# Patient Record
Sex: Female | Born: 1980 | Hispanic: No | Marital: Married | Smoking: Never smoker
Health system: Southern US, Community
[De-identification: ages and names within clinical notes are randomized; demographics above are authoritative.]

## PROBLEM LIST (undated history)

## (undated) ENCOUNTER — Inpatient Hospital Stay (HOSPITAL_COMMUNITY): Payer: Self-pay

## (undated) DIAGNOSIS — E119 Type 2 diabetes mellitus without complications: Secondary | ICD-10-CM

## (undated) DIAGNOSIS — K429 Umbilical hernia without obstruction or gangrene: Secondary | ICD-10-CM

## (undated) DIAGNOSIS — J45909 Unspecified asthma, uncomplicated: Secondary | ICD-10-CM

## (undated) DIAGNOSIS — K851 Biliary acute pancreatitis without necrosis or infection: Secondary | ICD-10-CM

## (undated) DIAGNOSIS — N390 Urinary tract infection, site not specified: Secondary | ICD-10-CM

## (undated) DIAGNOSIS — D649 Anemia, unspecified: Secondary | ICD-10-CM

## (undated) DIAGNOSIS — O24419 Gestational diabetes mellitus in pregnancy, unspecified control: Secondary | ICD-10-CM

## (undated) HISTORY — DX: Anemia, unspecified: D64.9

## (undated) HISTORY — PX: NO PAST SURGERIES: SHX2092

## (undated) HISTORY — DX: Type 2 diabetes mellitus without complications: E11.9

---

## 2008-12-25 LAB — SICKLE CELL SCREEN: Sickle Cell Screen: NORMAL

## 2008-12-31 ENCOUNTER — Ambulatory Visit (HOSPITAL_COMMUNITY): Admission: RE | Admit: 2008-12-31 | Discharge: 2008-12-31 | Payer: Self-pay | Admitting: Obstetrics & Gynecology

## 2009-04-04 ENCOUNTER — Inpatient Hospital Stay (HOSPITAL_COMMUNITY): Admission: RE | Admit: 2009-04-04 | Discharge: 2009-04-07 | Payer: Self-pay | Admitting: Obstetrics and Gynecology

## 2009-04-04 ENCOUNTER — Ambulatory Visit: Payer: Self-pay | Admitting: Obstetrics & Gynecology

## 2009-04-06 ENCOUNTER — Encounter: Payer: Self-pay | Admitting: Obstetrics and Gynecology

## 2010-08-04 LAB — CBC
HCT: 36.4 % (ref 36.0–46.0)
Hemoglobin: 9.3 g/dL — ABNORMAL LOW (ref 12.0–15.0)
MCHC: 34.3 g/dL (ref 30.0–36.0)
MCV: 94 fL (ref 78.0–100.0)
Platelets: 151 10*3/uL (ref 150–400)
RDW: 15.1 % (ref 11.5–15.5)
RDW: 15.2 % (ref 11.5–15.5)
WBC: 6 10*3/uL (ref 4.0–10.5)

## 2010-08-04 LAB — RPR: RPR Ser Ql: NONREACTIVE

## 2012-02-29 ENCOUNTER — Other Ambulatory Visit (HOSPITAL_COMMUNITY): Payer: Self-pay | Admitting: Nurse Practitioner

## 2012-02-29 DIAGNOSIS — O3660X0 Maternal care for excessive fetal growth, unspecified trimester, not applicable or unspecified: Secondary | ICD-10-CM

## 2012-02-29 DIAGNOSIS — Z3689 Encounter for other specified antenatal screening: Secondary | ICD-10-CM

## 2012-02-29 LAB — OB RESULTS CONSOLE ABO/RH: RH Type: POSITIVE

## 2012-02-29 LAB — OB RESULTS CONSOLE GBS: GBS: POSITIVE

## 2012-02-29 LAB — OB RESULTS CONSOLE GC/CHLAMYDIA: Gonorrhea: NEGATIVE

## 2012-02-29 LAB — CYSTIC FIBROSIS DIAGNOSTIC STUDY: Interpretation-CFDNA:: NEGATIVE

## 2012-02-29 LAB — OB RESULTS CONSOLE ANTIBODY SCREEN: Antibody Screen: NEGATIVE

## 2012-03-02 ENCOUNTER — Ambulatory Visit (HOSPITAL_COMMUNITY)
Admission: RE | Admit: 2012-03-02 | Discharge: 2012-03-02 | Disposition: A | Discharge status: Home / Self Care | Payer: Medicaid Other | Source: Ambulatory Visit | Attending: Nurse Practitioner | Admitting: Nurse Practitioner

## 2012-03-02 DIAGNOSIS — Z3689 Encounter for other specified antenatal screening: Secondary | ICD-10-CM

## 2012-03-02 DIAGNOSIS — Z363 Encounter for antenatal screening for malformations: Secondary | ICD-10-CM | POA: Insufficient documentation

## 2012-03-02 DIAGNOSIS — O358XX0 Maternal care for other (suspected) fetal abnormality and damage, not applicable or unspecified: Secondary | ICD-10-CM | POA: Insufficient documentation

## 2012-03-02 DIAGNOSIS — Z1389 Encounter for screening for other disorder: Secondary | ICD-10-CM | POA: Insufficient documentation

## 2012-03-02 DIAGNOSIS — O3660X0 Maternal care for excessive fetal growth, unspecified trimester, not applicable or unspecified: Secondary | ICD-10-CM | POA: Insufficient documentation

## 2012-04-26 ENCOUNTER — Encounter (HOSPITAL_COMMUNITY): Payer: Self-pay | Admitting: *Deleted

## 2012-04-26 ENCOUNTER — Inpatient Hospital Stay (HOSPITAL_COMMUNITY)
Admission: AD | Admit: 2012-04-26 | Discharge: 2012-04-26 | Disposition: A | Discharge status: Home / Self Care | Payer: Self-pay | Source: Ambulatory Visit | Attending: Obstetrics and Gynecology | Admitting: Obstetrics and Gynecology

## 2012-04-26 DIAGNOSIS — R079 Chest pain, unspecified: Secondary | ICD-10-CM | POA: Insufficient documentation

## 2012-04-26 DIAGNOSIS — O368990 Maternal care for other specified fetal problems, unspecified trimester, not applicable or unspecified: Secondary | ICD-10-CM

## 2012-04-26 DIAGNOSIS — M549 Dorsalgia, unspecified: Secondary | ICD-10-CM | POA: Insufficient documentation

## 2012-04-26 DIAGNOSIS — O4700 False labor before 37 completed weeks of gestation, unspecified trimester: Secondary | ICD-10-CM | POA: Insufficient documentation

## 2012-04-26 HISTORY — DX: Urinary tract infection, site not specified: N39.0

## 2012-04-26 HISTORY — DX: Unspecified asthma, uncomplicated: J45.909

## 2012-04-26 LAB — URINALYSIS, ROUTINE W REFLEX MICROSCOPIC
Hgb urine dipstick: NEGATIVE
Leukocytes, UA: NEGATIVE
Nitrite: NEGATIVE
Protein, ur: NEGATIVE mg/dL
Specific Gravity, Urine: 1.02 (ref 1.005–1.030)
Urobilinogen, UA: 0.2 mg/dL (ref 0.0–1.0)

## 2012-04-26 MED ORDER — CYCLOBENZAPRINE HCL 10 MG PO TABS
10.0000 mg | ORAL_TABLET | Freq: Once | ORAL | Status: AC
  Administered 2012-04-26: 10 mg via ORAL
  Filled 2012-04-26: qty 1

## 2012-04-26 MED ORDER — CYCLOBENZAPRINE HCL 10 MG PO TABS: 10.0000 mg | ORAL_TABLET | Freq: Three times a day (TID) | ORAL | Status: DC | PRN

## 2012-04-26 MED ORDER — ACETAMINOPHEN-CODEINE #3 300-30 MG PO TABS
1.0000 | ORAL_TABLET | Freq: Once | ORAL | Status: AC
  Administered 2012-04-26: 1 via ORAL
  Filled 2012-04-26: qty 1

## 2012-04-26 MED ORDER — GI COCKTAIL ~~LOC~~
30.0000 mL | Freq: Once | ORAL | Status: AC
  Administered 2012-04-26: 30 mL via ORAL
  Filled 2012-04-26: qty 30

## 2012-04-26 NOTE — MAU Provider Note (Signed)
  History     CSN: [id number]  Arrival date and time: 04/26/12 1536   None     Chief Complaint  Patient presents with  . Chest Pain   HPI  Pt is a G2P1001 at 29.[redacted] wks gestation here with report of chest pain that radiates to the upper middle back.  Pain started at 1500 today, pain increased in  intensity with movement.  No pain with breathing.  No recent illness or report of vomiting or coughing.    Past Medical History  Diagnosis Date  . Asthma   . Urinary tract infection     GBS in urine current preg    Past Surgical History  Procedure Date  . No past surgeries     Family History  Problem Relation Age of Onset  . Other Neg Hx     History  Substance Use Topics  . Smoking status: Never Smoker   . Smokeless tobacco: Never Used  . Alcohol Use: No    Allergies: No Known Allergies  Prescriptions prior to admission  Medication Sig Dispense Refill  . Prenatal Vit-Fe Fumarate-FA (MULTIVITAMIN-PRENATAL) 27-0.8 MG TABS Take 1 tablet by mouth daily.        Review of Systems  Gastrointestinal: Positive for abdominal pain (upper abdominal with movement).  Musculoskeletal: Positive for back pain.  All other systems reviewed and are negative.   Physical Exam   Blood pressure 113/66, pulse 87, temperature 97.4 F (36.3 C), temperature source Oral, resp. rate 22, height 5' 3.5" (1.613 m), weight 77.202 kg (170 lb 3.2 oz), SpO2 100.00%.  Physical Exam  Constitutional: She is oriented to person, place, and time. She appears well-developed and well-nourished. No distress.  HENT:  Head: Normocephalic.  Neck: Normal range of motion. Neck supple.  Cardiovascular: Normal rate, regular rhythm and normal heart sounds.   Respiratory: Effort normal and breath sounds normal.  GI: Soft. There is no tenderness.  Genitourinary: No bleeding around the vagina. Vaginal discharge (mucusy) found.  Neurological: She is alert and oriented to person, place, and time.  Skin: Skin is warm  and dry.   Dilation: Closed Effacement (%): Thick Cervical Position: Posterior Exam by:: Roney Marion, CNM FHR 130's,+accels, reactive Toco - irritability  MAU Course  Procedures  Results for orders placed during the hospital encounter of 04/26/12 (from the past 24 hour(s))  URINALYSIS, ROUTINE W REFLEX MICROSCOPIC     Status: Abnormal   Collection Time   04/26/12  3:45 PM      Component Value Range   Color, Urine YELLOW  YELLOW   APPearance HAZY (*) CLEAR   Specific Gravity, Urine 1.020  1.005 - 1.030   pH 6.0  5.0 - 8.0   Glucose, UA NEGATIVE  NEGATIVE mg/dL   Hgb urine dipstick NEGATIVE  NEGATIVE   Bilirubin Urine NEGATIVE  NEGATIVE   Ketones, ur NEGATIVE  NEGATIVE mg/dL   Protein, ur NEGATIVE  NEGATIVE mg/dL   Urobilinogen, UA 0.2  0.0 - 1.0 mg/dL   Nitrite NEGATIVE  NEGATIVE   Leukocytes, UA NEGATIVE  NEGATIVE     Assessment and Plan  Back Pain in Pregnancy Braxton Hicks  Plan: DC to home RX Flexeril Preterm Labor Precautions Call for an appointment next week  [facility] 04/26/2012, 4:18 PM

## 2012-04-26 NOTE — MAU Note (Addendum)
Pt sitting up when entered rm.  Repositioned on rt side, hob lowered.

## 2012-04-26 NOTE — MAU Note (Signed)
3 pm, started having chest pain and tightness, pain in upper back.  Has hx of asthma- does NOT have an inhaler, called husband to come home and bring her in . (pt is able to breath with mouth closed- resp unlabored, though rapid denies cough)

## 2012-04-26 NOTE — MAU Provider Note (Signed)
Attestation of Attending Supervision of Advanced Practitioner: Evaluation and management procedures were performed by the PA/NP/CNM/OB Fellow under my supervision/collaboration. Chart reviewed and agree with management and plan.  Dimitria Ketchum V 04/26/2012 11:19 PM

## 2012-05-03 NOTE — L&D Delivery Note (Signed)
Delivery Note At 10:07 PM a viable female was delivered via Vaginal, Spontaneous Delivery (Presentation: ; Occiput Anterior).  APGAR: 8, 8; weight 8lbs 9 oz .   Placenta status: Intact, Spontaneous.  Cord: 3 vessels with the following complications: None.    Anesthesia: None  Episiotomy: None Lacerations: 2nd degree;Vaginal Suture Repair: 3.0 vicryl Est. Blood Loss (mL): 200  Mom to postpartum.  Baby to nursery-stable.  Tawnya Crook 07/10/2012, 10:34 PM

## 2012-07-05 ENCOUNTER — Telehealth (HOSPITAL_COMMUNITY): Payer: Self-pay | Admitting: *Deleted

## 2012-07-05 ENCOUNTER — Encounter (HOSPITAL_COMMUNITY): Payer: Self-pay | Admitting: *Deleted

## 2012-07-05 NOTE — Telephone Encounter (Signed)
Preadmission screen  

## 2012-07-05 NOTE — Telephone Encounter (Signed)
Preadmission screen Interpreter number [phone]

## 2012-07-10 ENCOUNTER — Encounter (HOSPITAL_COMMUNITY): Payer: Self-pay | Admitting: *Deleted

## 2012-07-10 ENCOUNTER — Inpatient Hospital Stay (HOSPITAL_COMMUNITY)
Admission: AD | Admit: 2012-07-10 | Discharge: 2012-07-12 | DRG: 775 | Disposition: A | Discharge status: Home / Self Care | Payer: Medicaid Other | Source: Ambulatory Visit | Attending: Obstetrics & Gynecology | Admitting: Obstetrics & Gynecology

## 2012-07-10 DIAGNOSIS — Z22330 Carrier of Group B streptococcus: Secondary | ICD-10-CM

## 2012-07-10 DIAGNOSIS — O99892 Other specified diseases and conditions complicating childbirth: Secondary | ICD-10-CM | POA: Diagnosis present

## 2012-07-10 LAB — CBC
Hemoglobin: 12.5 g/dL (ref 12.0–15.0)
MCV: 93.1 fL (ref 78.0–100.0)
Platelets: 164 10*3/uL (ref 150–400)
RBC: 3.9 MIL/uL (ref 3.87–5.11)
WBC: 8.3 10*3/uL (ref 4.0–10.5)

## 2012-07-10 MED ORDER — ONDANSETRON HCL 4 MG/2ML IJ SOLN: 4.0000 mg | INTRAMUSCULAR | Status: DC | PRN

## 2012-07-10 MED ORDER — BENZOCAINE-MENTHOL 20-0.5 % EX AERO
1.0000 "application " | INHALATION_SPRAY | CUTANEOUS | Status: DC | PRN
  Filled 2012-07-10 (×2): qty 56

## 2012-07-10 MED ORDER — DIBUCAINE 1 % RE OINT: 1.0000 "application " | TOPICAL_OINTMENT | RECTAL | Status: DC | PRN

## 2012-07-10 MED ORDER — WITCH HAZEL-GLYCERIN EX PADS: 1.0000 "application " | MEDICATED_PAD | CUTANEOUS | Status: DC | PRN

## 2012-07-10 MED ORDER — SODIUM CHLORIDE 0.9 % IV SOLN
2.0000 g | Freq: Once | INTRAVENOUS | Status: AC
  Administered 2012-07-10: 2 g via INTRAVENOUS
  Filled 2012-07-10: qty 2000

## 2012-07-10 MED ORDER — TERBUTALINE SULFATE 1 MG/ML IJ SOLN: 0.2500 mg | Freq: Once | INTRAMUSCULAR | Status: DC | PRN

## 2012-07-10 MED ORDER — OXYTOCIN 40 UNITS IN LACTATED RINGERS INFUSION - SIMPLE MED
62.5000 mL/h | INTRAVENOUS | Status: DC
  Filled 2012-07-10: qty 1000

## 2012-07-10 MED ORDER — ACETAMINOPHEN 325 MG PO TABS
650.0000 mg | ORAL_TABLET | ORAL | Status: DC | PRN
  Administered 2012-07-11: 650 mg via ORAL
  Filled 2012-07-10: qty 1

## 2012-07-10 MED ORDER — TETANUS-DIPHTH-ACELL PERTUSSIS 5-2.5-18.5 LF-MCG/0.5 IM SUSP
0.5000 mL | Freq: Once | INTRAMUSCULAR | Status: AC
  Administered 2012-07-11: 0.5 mL via INTRAMUSCULAR

## 2012-07-10 MED ORDER — SIMETHICONE 80 MG PO CHEW: 80.0000 mg | CHEWABLE_TABLET | ORAL | Status: DC | PRN

## 2012-07-10 MED ORDER — OXYTOCIN BOLUS FROM INFUSION
500.0000 mL | INTRAVENOUS | Status: DC
  Administered 2012-07-10: 500 mL via INTRAVENOUS

## 2012-07-10 MED ORDER — LACTATED RINGERS IV SOLN: 500.0000 mL | INTRAVENOUS | Status: DC | PRN

## 2012-07-10 MED ORDER — LANOLIN HYDROUS EX OINT: TOPICAL_OINTMENT | CUTANEOUS | Status: DC | PRN

## 2012-07-10 MED ORDER — SENNOSIDES-DOCUSATE SODIUM 8.6-50 MG PO TABS
2.0000 | ORAL_TABLET | Freq: Every day | ORAL | Status: DC
  Administered 2012-07-11: 2 via ORAL

## 2012-07-10 MED ORDER — DIPHENHYDRAMINE HCL 25 MG PO CAPS: 25.0000 mg | ORAL_CAPSULE | Freq: Four times a day (QID) | ORAL | Status: DC | PRN

## 2012-07-10 MED ORDER — LIDOCAINE HCL (PF) 1 % IJ SOLN
30.0000 mL | INTRAMUSCULAR | Status: DC | PRN
  Administered 2012-07-10: 30 mL via SUBCUTANEOUS
  Filled 2012-07-10 (×2): qty 30

## 2012-07-10 MED ORDER — CITRIC ACID-SODIUM CITRATE 334-500 MG/5ML PO SOLN: 30.0000 mL | ORAL | Status: DC | PRN

## 2012-07-10 MED ORDER — ONDANSETRON HCL 4 MG/2ML IJ SOLN: 4.0000 mg | Freq: Four times a day (QID) | INTRAMUSCULAR | Status: DC | PRN

## 2012-07-10 MED ORDER — OXYCODONE-ACETAMINOPHEN 5-325 MG PO TABS
1.0000 | ORAL_TABLET | ORAL | Status: DC | PRN
  Administered 2012-07-11 – 2012-07-12 (×5): 1 via ORAL
  Filled 2012-07-10 (×5): qty 1

## 2012-07-10 MED ORDER — LACTATED RINGERS IV SOLN
INTRAVENOUS | Status: DC
  Administered 2012-07-10: 16:00:00 via INTRAVENOUS

## 2012-07-10 MED ORDER — OXYTOCIN 40 UNITS IN LACTATED RINGERS INFUSION - SIMPLE MED
1.0000 m[IU]/min | INTRAVENOUS | Status: DC
  Administered 2012-07-10: 1 m[IU]/min via INTRAVENOUS

## 2012-07-10 MED ORDER — ZOLPIDEM TARTRATE 5 MG PO TABS: 5.0000 mg | ORAL_TABLET | Freq: Every evening | ORAL | Status: DC | PRN

## 2012-07-10 MED ORDER — ACETAMINOPHEN 325 MG PO TABS: 650.0000 mg | ORAL_TABLET | ORAL | Status: DC | PRN

## 2012-07-10 MED ORDER — PRENATAL MULTIVITAMIN CH
1.0000 | ORAL_TABLET | Freq: Every day | ORAL | Status: DC
  Administered 2012-07-12: 1 via ORAL
  Filled 2012-07-10: qty 1

## 2012-07-10 MED ORDER — ONDANSETRON HCL 4 MG PO TABS: 4.0000 mg | ORAL_TABLET | ORAL | Status: DC | PRN

## 2012-07-10 MED ORDER — FENTANYL CITRATE 0.05 MG/ML IJ SOLN
100.0000 ug | INTRAMUSCULAR | Status: DC | PRN
  Administered 2012-07-10 (×2): 100 ug via INTRAVENOUS
  Filled 2012-07-10 (×2): qty 2

## 2012-07-10 MED ORDER — OXYCODONE-ACETAMINOPHEN 5-325 MG PO TABS
1.0000 | ORAL_TABLET | ORAL | Status: DC | PRN
  Administered 2012-07-10: 1 via ORAL
  Filled 2012-07-10: qty 1

## 2012-07-10 NOTE — Progress Notes (Signed)
U/S at bedside by Dr. Marice Potter to determine presentation.  Cephalic presentation noted by MD

## 2012-07-10 NOTE — H&P (Signed)
Jill Ray is a 32 y.o. female presenting for active labor. Her contractions began last night and became stronger and regular this morning around 3am. She denies vaginal bleeding, discharge, LOF, and decreased fetal movement. She has a Hx of asthma but has not had to use any inhalers during her pregnancy. She does have occasional dyspnea. She has had a mild HA this morning. She denies vison changes, dysuria, and swelling. She denies any problems in her pregnancy and has seen the health department for her care.  History OB History   Grav Para Term Preterm Abortions TAB SAB Ect Mult Living   2 1 1  0 0 0 0 0 0 1     Past Medical History  Diagnosis Date  . Asthma   . Urinary tract infection     GBS in urine current preg  . Anemia    Past Surgical History  Procedure Laterality Date  . No past surgeries     Family History: family history includes Asthma in her maternal grandfather.  There is no history of Other. Social History:  reports that she has never smoked. She has never used smokeless tobacco. She reports that she does not drink alcohol or use illicit drugs.   Prenatal Transfer Tool  Maternal Diabetes: No Genetic Screening: Declined Maternal Ultrasounds/Referrals: Normal Fetal Ultrasounds or other Referrals:  None Maternal Substance Abuse:  No Significant Maternal Medications:  None Significant Maternal Lab Results:  Lab values include: Group B Strep positive Other Comments:  None  ROS Per HPI  Dilation: 8 Effacement (%): 100 Exam by:: Lynda Rainwater RN  Blood pressure 115/73, pulse 104, resp. rate 18. Exam Physical Exam  Gen: NAD, alert, cooperative with exam HEENT: NCAT, moist mucosal membranes CV: RRR, good S1/S2, no murmur Resp: CTABL, no wheezes, non-labored Abd: Soft, pregnant abdomen, no tenderness to palpation Ext: No edema Neuro: Alert and oriented, No gross deficits  FHT: Baseline 145, moderate variability, no accels, no decels Toco: irreg q3-5  min  Prenatal labs: ABO, Rh: O/Positive/-- (10/29 0000) Antibody: Negative (10/29 0000) Rubella: Immune (10/29 0000) RPR: Nonreactive (10/29 0000)  HBsAg: Negative (10/29 0000)  HIV: Non-reactive (10/29 0000)  GBS: Positive (10/29 0000)   Assessment/Plan: 32 y/o G2P1001 here in active labor - Active labor, manage expectantly has changed from 6 to 8 cm dilated since being sent from clinic, membranes intact - Category 1 fetal tracing - GBS positive- ampicillin - Anticipate SVD  Kevin Fenton 07/10/2012, 4:02 PM  I saw and examined patient along with student and agree with above note.   Sadiel Mota 07/13/2012 3:57 PM

## 2012-07-11 DIAGNOSIS — O9989 Other specified diseases and conditions complicating pregnancy, childbirth and the puerperium: Secondary | ICD-10-CM

## 2012-07-11 DIAGNOSIS — Z22330 Carrier of Group B streptococcus: Secondary | ICD-10-CM

## 2012-07-11 LAB — ABO/RH: ABO/RH(D): O POS

## 2012-07-11 LAB — SYPHILIS: RPR W/REFLEX TO RPR TITER AND TREPONEMAL ANTIBODIES, TRADITIONAL SCREENING AND DIAGNOSIS ALGORITHM: RPR Ser Ql: NONREACTIVE

## 2012-07-12 MED ORDER — ACETAMINOPHEN-CODEINE 300-30 MG PO TABS: 1.0000 | ORAL_TABLET | ORAL | Status: DC | PRN

## 2012-07-12 NOTE — Discharge Summary (Signed)
Obstetric Discharge Summary Reason for Admission: onset of labor Prenatal Procedures: NST and ultrasound Intrapartum Procedures: spontaneous vaginal delivery and GBS prophylaxis Postpartum Procedures: none Complications-Operative and Postpartum: 2nd degree perineal laceration Hemoglobin  Date Value Range Status  07/10/2012 12.5  12.0 - 15.0 g/dL Final     HCT  Date Value Range Status  07/10/2012 36.3  36.0 - 46.0 % Final    Physical Exam:  Filed Vitals:   07/12/12 0600  BP: 95/64  Pulse: 99  Temp: 97.9 F (36.6 C)  Resp: 20    General: alert, cooperative and no distress Lochia: appropriate Uterine Fundus: firm Incision: None DVT Evaluation: No evidence of DVT seen on physical exam. Negative Homan's sign. No cords or calf tenderness.  Discharge Diagnoses: Term Pregnancy-delivered  Discharge Information: Date: 07/12/2012 Activity: pelvic rest Diet: routine Medications: Ibuprofen Condition: stable Instructions: refer to practice specific booklet Discharge to: home   Newborn Data: Live born female  Birth Weight: 8 lb 9.9 oz (3910 g) APGAR: 8, 8  Home with mother.  [facility] 07/12/2012, 9:15 AM

## 2012-07-12 NOTE — Discharge Summary (Signed)
Attestation of Attending Supervision of Advanced Practitioner (CNM/NP): Evaluation and management procedures were performed by the Advanced Practitioner under my supervision and collaboration.  I have reviewed the Advanced Practitioner's note and chart, and I agree with the management and plan.  CONSTANT,PEGGY 07/12/2012 11:07 AM   

## 2012-07-12 NOTE — Progress Notes (Signed)
UR chart review completed.  

## 2012-07-17 ENCOUNTER — Encounter: Payer: Self-pay | Admitting: Obstetrics and Gynecology

## 2012-08-08 ENCOUNTER — Encounter (HOSPITAL_COMMUNITY): Payer: Self-pay | Admitting: Physical Medicine and Rehabilitation

## 2012-08-08 ENCOUNTER — Inpatient Hospital Stay (HOSPITAL_COMMUNITY)
Admission: EM | Admit: 2012-08-08 | Discharge: 2012-08-14 | DRG: 769 | Disposition: A | Discharge status: Home / Self Care | Payer: Medicaid Other | Attending: General Surgery | Admitting: General Surgery

## 2012-08-08 ENCOUNTER — Emergency Department (HOSPITAL_COMMUNITY): Payer: Medicaid Other

## 2012-08-08 DIAGNOSIS — O99893 Other specified diseases and conditions complicating puerperium: Secondary | ICD-10-CM | POA: Diagnosis present

## 2012-08-08 DIAGNOSIS — R112 Nausea with vomiting, unspecified: Secondary | ICD-10-CM | POA: Diagnosis present

## 2012-08-08 DIAGNOSIS — K8020 Calculus of gallbladder without cholecystitis without obstruction: Secondary | ICD-10-CM

## 2012-08-08 DIAGNOSIS — K828 Other specified diseases of gallbladder: Secondary | ICD-10-CM | POA: Diagnosis present

## 2012-08-08 DIAGNOSIS — K859 Acute pancreatitis without necrosis or infection, unspecified: Secondary | ICD-10-CM | POA: Diagnosis present

## 2012-08-08 DIAGNOSIS — O9081 Anemia of the puerperium: Secondary | ICD-10-CM | POA: Diagnosis present

## 2012-08-08 DIAGNOSIS — K851 Biliary acute pancreatitis without necrosis or infection: Secondary | ICD-10-CM | POA: Diagnosis present

## 2012-08-08 DIAGNOSIS — O9089 Other complications of the puerperium, not elsewhere classified: Principal | ICD-10-CM | POA: Diagnosis present

## 2012-08-08 DIAGNOSIS — K8000 Calculus of gallbladder with acute cholecystitis without obstruction: Secondary | ICD-10-CM | POA: Diagnosis present

## 2012-08-08 DIAGNOSIS — D649 Anemia, unspecified: Secondary | ICD-10-CM | POA: Diagnosis present

## 2012-08-08 DIAGNOSIS — K429 Umbilical hernia without obstruction or gangrene: Secondary | ICD-10-CM | POA: Diagnosis present

## 2012-08-08 HISTORY — DX: Umbilical hernia without obstruction or gangrene: K42.9

## 2012-08-08 HISTORY — DX: Biliary acute pancreatitis without necrosis or infection: K85.10

## 2012-08-08 LAB — CBC WITH DIFFERENTIAL/PLATELET
Basophils Absolute: 0 10*3/uL (ref 0.0–0.1)
HCT: 40.4 % (ref 36.0–46.0)
Lymphocytes Relative: 13 % (ref 12–46)
Lymphs Abs: 1.2 10*3/uL (ref 0.7–4.0)
MCV: 87.6 fL (ref 78.0–100.0)
Monocytes Absolute: 0.7 10*3/uL (ref 0.1–1.0)
Neutro Abs: 7.4 10*3/uL (ref 1.7–7.7)
Platelets: 186 10*3/uL (ref 150–400)
RBC: 4.61 MIL/uL (ref 3.87–5.11)
RDW: 12.6 % (ref 11.5–15.5)
WBC: 9.4 10*3/uL (ref 4.0–10.5)

## 2012-08-08 LAB — CBC
MCH: 31.2 pg (ref 26.0–34.0)
Platelets: 205 10*3/uL (ref 150–400)
RBC: 4.46 MIL/uL (ref 3.87–5.11)

## 2012-08-08 LAB — URINALYSIS, ROUTINE W REFLEX MICROSCOPIC
Bilirubin Urine: NEGATIVE
Glucose, UA: NEGATIVE mg/dL
Ketones, ur: NEGATIVE mg/dL
Protein, ur: NEGATIVE mg/dL

## 2012-08-08 LAB — CREATININE, SERUM: Creatinine, Ser: 0.47 mg/dL — ABNORMAL LOW (ref 0.50–1.10)

## 2012-08-08 LAB — COMPREHENSIVE METABOLIC PANEL
ALT: 328 U/L — ABNORMAL HIGH (ref 0–35)
AST: 419 U/L — ABNORMAL HIGH (ref 0–37)
CO2: 25 mEq/L (ref 19–32)
Chloride: 106 mEq/L (ref 96–112)
GFR calc Af Amer: >90 mL/min (ref >90)
GFR calc non Af Amer: >90 mL/min (ref >90)
Glucose, Bld: 119 mg/dL — ABNORMAL HIGH (ref 70–99)
Sodium: 142 mEq/L (ref 135–145)
Total Bilirubin: 0.4 mg/dL (ref 0.3–1.2)

## 2012-08-08 LAB — URINE MICROSCOPIC-ADD ON

## 2012-08-08 MED ORDER — PANTOPRAZOLE SODIUM 40 MG IV SOLR
40.0000 mg | Freq: Every day | INTRAVENOUS | Status: DC
  Administered 2012-08-08 – 2012-08-13 (×6): 40 mg via INTRAVENOUS
  Filled 2012-08-08 (×9): qty 40

## 2012-08-08 MED ORDER — HEPARIN SODIUM (PORCINE) 5000 UNIT/ML IJ SOLN
5000.0000 [IU] | Freq: Three times a day (TID) | INTRAMUSCULAR | Status: DC
  Administered 2012-08-08 – 2012-08-14 (×14): 5000 [IU] via SUBCUTANEOUS
  Filled 2012-08-08 (×22): qty 1

## 2012-08-08 MED ORDER — ONDANSETRON HCL 4 MG/2ML IJ SOLN
4.0000 mg | Freq: Once | INTRAMUSCULAR | Status: AC
  Administered 2012-08-08: 4 mg via INTRAVENOUS
  Filled 2012-08-08: qty 2

## 2012-08-08 MED ORDER — HYDROMORPHONE HCL PF 1 MG/ML IJ SOLN
0.5000 mg | Freq: Once | INTRAMUSCULAR | Status: AC
  Administered 2012-08-08: 0.5 mg via INTRAVENOUS
  Filled 2012-08-08: qty 1

## 2012-08-08 MED ORDER — SODIUM CHLORIDE 0.9 % IV SOLN
INTRAVENOUS | Status: DC
  Administered 2012-08-08 – 2012-08-09 (×2): via INTRAVENOUS

## 2012-08-08 MED ORDER — GI COCKTAIL ~~LOC~~
30.0000 mL | Freq: Once | ORAL | Status: AC
Start: 2012-08-08 — End: 2012-08-08
  Administered 2012-08-08: 30 mL via ORAL
  Filled 2012-08-08: qty 30

## 2012-08-08 MED ORDER — ONDANSETRON HCL 4 MG/2ML IJ SOLN
4.0000 mg | Freq: Four times a day (QID) | INTRAMUSCULAR | Status: DC | PRN
  Administered 2012-08-11: 4 mg via INTRAVENOUS
  Filled 2012-08-08: qty 2

## 2012-08-08 MED ORDER — ACETAMINOPHEN 325 MG PO TABS
650.0000 mg | ORAL_TABLET | Freq: Four times a day (QID) | ORAL | Status: DC | PRN
  Administered 2012-08-13 – 2012-08-14 (×3): 650 mg via ORAL
  Filled 2012-08-08: qty 2
  Filled 2012-08-08: qty 1
  Filled 2012-08-08 (×5): qty 2

## 2012-08-08 MED ORDER — SODIUM CHLORIDE 0.9 % IV SOLN
1000.0000 mL | Freq: Once | INTRAVENOUS | Status: AC
  Administered 2012-08-08: 1000 mL via INTRAVENOUS

## 2012-08-08 MED ORDER — ONDANSETRON HCL 4 MG/2ML IJ SOLN
4.0000 mg | Freq: Once | INTRAMUSCULAR | Status: AC
  Administered 2012-08-08: 4 mg via INTRAVENOUS

## 2012-08-08 MED ORDER — HYDROMORPHONE HCL PF 1 MG/ML IJ SOLN
0.5000 mg | INTRAMUSCULAR | Status: DC | PRN
  Administered 2012-08-08 (×4): 0.5 mg via INTRAVENOUS
  Filled 2012-08-08 (×4): qty 1

## 2012-08-08 MED ORDER — ONDANSETRON HCL 4 MG/2ML IJ SOLN
INTRAMUSCULAR | Status: AC
  Filled 2012-08-08: qty 2

## 2012-08-08 MED ORDER — MORPHINE SULFATE 2 MG/ML IJ SOLN
2.0000 mg | INTRAMUSCULAR | Status: DC | PRN
  Administered 2012-08-08 – 2012-08-11 (×14): 2 mg via INTRAVENOUS
  Filled 2012-08-08 (×13): qty 1

## 2012-08-08 MED ORDER — ACETAMINOPHEN 650 MG RE SUPP
650.0000 mg | Freq: Four times a day (QID) | RECTAL | Status: DC | PRN
  Administered 2012-08-09 – 2012-08-10 (×3): 650 mg via RECTAL
  Filled 2012-08-08 (×3): qty 1

## 2012-08-08 MED ORDER — MORPHINE SULFATE 2 MG/ML IJ SOLN
INTRAMUSCULAR | Status: AC
  Filled 2012-08-08: qty 1

## 2012-08-08 MED ORDER — SODIUM CHLORIDE 0.9 % IV SOLN
1000.0000 mL | Freq: Once | INTRAVENOUS | Status: AC
Start: 2012-08-08 — End: 2012-08-08
  Administered 2012-08-08: 1000 mL via INTRAVENOUS

## 2012-08-08 NOTE — ED Notes (Addendum)
Pt presents to department for evaluation of epigastric pain radiating under R breast area to back. Onset this morning @ 05:00. Pt states she has same symptoms when she was pregnant, had new baby 22 days ago. Also states SOB and generalized fatigue. Pt is conscious alert and oriented x4. 8/10 pain at the time.

## 2012-08-08 NOTE — H&P (Signed)
Jill Ray is an 32 y.o. female.   Chief Complaint: referred by Dr. Jeraldine Loots HPI: 66 yof who delivered her son 22 days ago.  She has now three episodes of ruq/epigastric pain radiating to the back.  This is associated with n/v.  This episode began this am and is the worst.  She denies fevers.  Nothing was relieving this at home. Unable to eat.  She presented to er.  Past Medical History  Diagnosis Date  . Asthma   . Urinary tract infection     GBS in urine current preg  . Anemia     Past Surgical History  Procedure Laterality Date  . No past surgeries      Family History  Problem Relation Age of Onset  . Other Neg Hx   . Asthma Maternal Grandfather    Social History:  reports that she has never smoked. She has never used smokeless tobacco. She reports that she does not drink alcohol or use illicit drugs.  Allergies:  Allergies  Allergen Reactions  . Levaquin (Levofloxacin In D5w) Itching   meds non  Results for orders placed during the hospital encounter of 08/08/12 (from the past 48 hour(s))  CBC WITH DIFFERENTIAL     Status: Abnormal   Collection Time    08/08/12  7:39 AM      Result Value Range   WBC 9.4  4.0 - 10.5 K/uL   RBC 4.61  3.87 - 5.11 MIL/uL   Hemoglobin 14.4  12.0 - 15.0 g/dL   HCT 16.1  09.6 - 04.5 %   MCV 87.6  78.0 - 100.0 fL   MCH 31.2  26.0 - 34.0 pg   MCHC 35.6  30.0 - 36.0 g/dL   RDW 40.9  81.1 - 91.4 %   Platelets 186  150 - 400 K/uL   Neutrophils Relative 78 (*) 43 - 77 %   Neutro Abs 7.4  1.7 - 7.7 K/uL   Lymphocytes Relative 13  12 - 46 %   Lymphs Abs 1.2  0.7 - 4.0 K/uL   Monocytes Relative 7  3 - 12 %   Monocytes Absolute 0.7  0.1 - 1.0 K/uL   Eosinophils Relative 1  0 - 5 %   Eosinophils Absolute 0.1  0.0 - 0.7 K/uL   Basophils Relative 0  0 - 1 %   Basophils Absolute 0.0  0.0 - 0.1 K/uL  COMPREHENSIVE METABOLIC PANEL     Status: Abnormal   Collection Time    08/08/12  7:39 AM      Result Value Range   Sodium 142  135 - 145  mEq/L   Potassium 4.0  3.5 - 5.1 mEq/L   Chloride 106  96 - 112 mEq/L   CO2 25  19 - 32 mEq/L   Glucose, Bld 119 (*) 70 - 99 mg/dL   BUN 13  6 - 23 mg/dL   Creatinine, Ser 7.82  0.50 - 1.10 mg/dL   Calcium 9.9  8.4 - 95.6 mg/dL   Total Protein 7.6  6.0 - 8.3 g/dL   Albumin 3.7  3.5 - 5.2 g/dL   AST 213 (*) 0 - 37 U/L   ALT 328 (*) 0 - 35 U/L   Alkaline Phosphatase 227 (*) 39 - 117 U/L   Total Bilirubin 0.4  0.3 - 1.2 mg/dL   GFR calc non Af Amer >90  >90 mL/min   GFR calc Af Amer >90  >90 mL/min   Comment:  The eGFR has been calculated     using the CKD EPI equation.     This calculation has not been     validated in all clinical     situations.     eGFR's persistently     <90 mL/min signify     possible Chronic Kidney Disease.  LIPASE, BLOOD     Status: Abnormal   Collection Time    08/08/12  7:39 AM      Result Value Range   Lipase >3000 (*) 11 - 59 U/L   Comment: RESULT CONFIRMED BY AUTOMATED DILUTION   US Abdomen Complete  08/08/2012  *RADIOLOGY REPORT*  Clinical Data:  Abdominal pain and chest pain.  COMPLETE ABDOMINAL ULTRASOUND  Comparison:  None.  Findings:  Gallbladder:  Numerous small shadowing gallstones and moderate echogenic sludge within the gallbladder.  Circumferential gallbladder wall thickening up to approximately 6 mm.  Largest gallstone is on the order of 1.1 cm.  No pericholecystic fluid. Negative sonographic Murphy's sign according to the ultrasound technologist.  Common bile duct:  Dilated up to approximately 11 mm.  Sludge and small stones suspected within the common bile duct.  Liver:  Normal size and echotexture without focal parenchymal abnormality.  Patent portal vein with hepatopetal flow.  IVC:  Patent.  Pancreas:  Although the pancreas is difficult to visualize in its entirety, no focal pancreatic abnormality is identified.  Spleen:  Normal size and echotexture without focal parenchymal abnormality.  Right Kidney:  No hydronephrosis.   Well-preserved cortex.  No shadowing calculi.  Normal size and parenchymal echotexture without focal abnormalities.  Approximately 10.4 cm in length.  Left Kidney:  No hydronephrosis.  Well-preserved cortex.  No shadowing calculi.  Normal size and parenchymal echotexture without focal abnormalities.  Approximately 9.8 cm in length.  Abdominal aorta:  Normal in caliber throughout its visualized course in the abdomen.  Obscured distally by overlying bowel gas.  IMPRESSION:  1.  Cholelithiasis, gallbladder sludge, and gallbladder wall thickening consistent with cholecystitis. 2.  Mild extrahepatic biliary ductal dilation with evidence of sludge and small stones in the common duct. 3.  Otherwise normal examination with a caveat that the distal abdominal aorta was obscured by overlying bowel gas and was therefore not evaluated.   Original Report Authenticated By: Hulan Saas, M.D.     Review of Systems  Gastrointestinal: Positive for nausea, vomiting and abdominal pain.    Blood pressure 113/76, pulse 87, temperature 98.5 F (36.9 C), temperature source Oral, resp. rate 18, SpO2 95.00%, unknown if currently breastfeeding. Physical Exam  Vitals reviewed. Constitutional: She appears well-developed and well-nourished.  Eyes: No scleral icterus.  Cardiovascular: Normal rate, regular rhythm and normal heart sounds.   Respiratory: Effort normal and breath sounds normal. She has no wheezes. She has no rales.  GI: Normal appearance and bowel sounds are normal. She exhibits no distension. There is tenderness in the right upper quadrant and epigastric area. A hernia (reducible umbilical hernia) is present.  Lymphadenopathy:    She has no cervical adenopathy.     Assessment/Plan GSP  Discussed stones as source of pancreatitis.  Will plan on conservative therapy for pancreatitis and allow this to resolve.  Once this resolves will plan lap chole prior to discharge.  Deavion Dobbs 08/08/2012, 11:54  AM

## 2012-08-08 NOTE — ED Notes (Signed)
Pt actively vomiting. Medicated, will continue to monitor. Vital signs stable. 8/10 abdominal pain at the time. She remains alert and oriented x4.

## 2012-08-08 NOTE — ED Notes (Signed)
Pt transported to ultrasound.

## 2012-08-08 NOTE — ED Provider Notes (Signed)
History     CSN: [id number]  Arrival date & time 08/08/12  0705   First MD Initiated Contact with Patient 08/08/12 0715      Chief Complaint  Patient presents with  . Abdominal Pain  . Chest Pain    HPI  Patient presents with epigastric pain, nausea, vomiting. Symptoms began approximately 3 hours prior to ED arrival.  Onset was sudden.  Since onset the pain has been severe, nonradiating, in the epigastrium.  There is associated nausea with several episodes of emesis. No relief with anything. No concurrent fever, chest pain, significant dyspnea, diarrhea, incontinence or other complaints. She states that she has similar prior episodes, including one that required ER evaluation. She denies a known history of gallbladder or stomach pathology. She recently delivered a healthy young female full-term with no palpitations beyond the GBS. No history of eclampsia, nor preeclampsia during pregnancy.  Past Medical History  Diagnosis Date  . Asthma   . Urinary tract infection     GBS in urine current preg  . Anemia     Past Surgical History  Procedure Laterality Date  . No past surgeries      Family History  Problem Relation Age of Onset  . Other Neg Hx   . Asthma Maternal Grandfather     History  Substance Use Topics  . Smoking status: Never Smoker   . Smokeless tobacco: Never Used  . Alcohol Use: No    OB History   Grav Para Term Preterm Abortions TAB SAB Ect Mult Living   2 2 2  0 0 0 0 0 0 2      Review of Systems  Constitutional:       Per HPI, otherwise negative  HENT:       Per HPI, otherwise negative  Respiratory:       Per HPI, otherwise negative  Cardiovascular:       Per HPI, otherwise negative  Gastrointestinal: Positive for nausea, vomiting and abdominal pain. Negative for diarrhea, blood in stool and anal bleeding.  Endocrine:       Negative aside from HPI  Genitourinary: Negative for vaginal bleeding and vaginal discharge.  Musculoskeletal:        Per HPI, otherwise negative  Skin: Negative.   Neurological: Negative for syncope.    Allergies  Levaquin  Home Medications   Current Outpatient Rx  Name  Route  Sig  Dispense  Refill  . calcium carbonate (OS-CAL) 1250 MG chewable tablet   Oral   Chew 1 tablet by mouth once.         Marland Kitchen omeprazole (PRILOSEC) 20 MG capsule   Oral   Take 40 mg by mouth daily.         . Prenatal Vit-Fe Fumarate-FA (MULTIVITAMIN-PRENATAL) 27-0.8 MG TABS   Oral   Take 1 tablet by mouth daily.           BP 124/80  Pulse 78  Temp(Src) 98.5 F (36.9 C) (Oral)  Resp 18  SpO2 100%  Physical Exam  Nursing note and vitals reviewed. Constitutional: She is oriented to person, place, and time. She appears well-developed and well-nourished. No distress.  HENT:  Head: Normocephalic and atraumatic.  Eyes: Conjunctivae and EOM are normal.  Cardiovascular: Normal rate and regular rhythm.   Pulmonary/Chest: Effort normal and breath sounds normal. No stridor. No respiratory distress.  Abdominal: She exhibits no distension. There is no hepatosplenomegaly. There is tenderness in the epigastric area. There is no rigidity, no  rebound, no guarding, no CVA tenderness, no tenderness at McBurney's point and negative Murphy's sign.  Musculoskeletal: She exhibits no edema.  Neurological: She is alert and oriented to person, place, and time. No cranial nerve deficit.  Skin: Skin is warm and dry.  Psychiatric: She has a normal mood and affect.   Cardiac: 80sr, normal  O2- 99%ra, normal   ED Course  Procedures (including critical care time)  Labs Reviewed  CBC WITH DIFFERENTIAL  COMPREHENSIVE METABOLIC PANEL  LIPASE, BLOOD  URINALYSIS, ROUTINE W REFLEX MICROSCOPIC   No results found.   No diagnosis found.   I reviewed the patient's chart, including details of her recent delivery.  9:20 AM Initial labs demonstrate elevated lipase, hepatic enzymes. On re-check the patient requests  analgesics.   Initial labs are notable for a lipase of greater than 3000.  Ultrasound pending. MDM  The patient presents with epigastric pain, nausea, vomiting.  On exam the patient is uncomfortable appearing.  Pain is relieved with intermittent Dilaudid, Zofran.  The patient's evaluation is notable for cholelithiasis, cholecystitis, likely pancreatic irritation secondary to biliary dysfunction.  The patient was admitted to the surgical team for further evaluation and management.      Gerhard Munch, MD 08/08/12 [phone]

## 2012-08-09 LAB — COMPREHENSIVE METABOLIC PANEL
ALT: 176 U/L — ABNORMAL HIGH (ref 0–35)
BUN: 8 mg/dL (ref 6–23)
Calcium: 8.3 mg/dL — ABNORMAL LOW (ref 8.4–10.5)
Creatinine, Ser: 0.41 mg/dL — ABNORMAL LOW (ref 0.50–1.10)
GFR calc Af Amer: >90 mL/min (ref >90)
Glucose, Bld: 117 mg/dL — ABNORMAL HIGH (ref 70–99)
Sodium: 139 mEq/L (ref 135–145)
Total Protein: 6.4 g/dL (ref 6.0–8.3)

## 2012-08-09 LAB — CBC
HCT: 37.9 % (ref 36.0–46.0)
Hemoglobin: 13.6 g/dL (ref 12.0–15.0)
RBC: 4.29 MIL/uL (ref 3.87–5.11)
WBC: 7.5 10*3/uL (ref 4.0–10.5)

## 2012-08-09 MED ORDER — CHLORHEXIDINE GLUCONATE 0.12 % MT SOLN
15.0000 mL | Freq: Two times a day (BID) | OROMUCOSAL | Status: DC
  Administered 2012-08-09 – 2012-08-14 (×8): 15 mL via OROMUCOSAL
  Filled 2012-08-09 (×8): qty 15

## 2012-08-09 MED ORDER — KCL IN DEXTROSE-NACL 20-5-0.45 MEQ/L-%-% IV SOLN
INTRAVENOUS | Status: DC
  Administered 2012-08-09: 100 mL/h via INTRAVENOUS
  Administered 2012-08-09 – 2012-08-14 (×6): via INTRAVENOUS
  Filled 2012-08-09 (×12): qty 1000

## 2012-08-09 MED ORDER — BIOTENE DRY MOUTH MT LIQD
15.0000 mL | Freq: Two times a day (BID) | OROMUCOSAL | Status: DC
  Administered 2012-08-09 – 2012-08-12 (×5): 15 mL via OROMUCOSAL

## 2012-08-09 NOTE — Progress Notes (Signed)
Agree with above, await pancreatitis to resolve, she is better clinically since yesterday

## 2012-08-09 NOTE — Progress Notes (Signed)
Nutrition Brief Note  Patient identified on the Malnutrition Screening Tool (MST) Report  Current diet order is NPO. Labs and medications reviewed.   Pt admitted with acute pancreatitis.  Very sleepy at time of visit, however declines nutrition-related needs at this time.  Per chart review, no evidence of recent wt loss. No nutrition interventions warranted at this time. If nutrition issues arise, please consult RD.   Loyce Dys, MS RD LDN Clinical Inpatient Dietitian Pager: [phone] Weekend/After hours pager: [phone]

## 2012-08-09 NOTE — Progress Notes (Signed)
Subjective: Pt still in epigastric and RUQ abdominal pain and c/o of nausea, but no vomiting.  NPO.  Ambulating some.  +flatus, no BM recently.    Objective: Vital signs in last 24 hours: Temp:  [97.8 F (36.6 C)-98.7 F (37.1 C)] 98.7 F (37.1 C) (04/09 0500) Pulse Rate:  [79-88] 88 (04/09 0500) Resp:  [16-20] 16 (04/09 0500) BP: (108-124)/(56-79) 117/75 mmHg (04/09 0500) SpO2:  [95 %-100 %] 99 % (04/09 0500)    Intake/Output from previous day: 04/08 0701 - 04/09 0700 In: 2000 [I.V.:2000] Out: 950 [Urine:800] Intake/Output this shift:    PE: Gen:  Alert, NAD, pleasant Abd: Soft, moderate tenderness in upper abdomen, ND, +BS, no HSM   Lab Results:   Recent Labs  08/08/12 1515 08/09/12 0510  WBC 10.6* 7.5  HGB 13.9 13.6  HCT 39.6 37.9  PLT 205 198   BMET  Recent Labs  08/08/12 0739 08/08/12 1515 08/09/12 0510  NA 142  --  139  K 4.0  --  3.4*  CL 106  --  105  CO2 25  --  26  GLUCOSE 119*  --  117*  BUN 13  --  8  CREATININE 0.59 0.47* 0.41*  CALCIUM 9.9  --  8.3*   PT/INR No results found for this basename: LABPROT, INR,  in the last 72 hours CMP     Component Value Date/Time   NA 139 08/09/2012 0510   K 3.4* 08/09/2012 0510   CL 105 08/09/2012 0510   CO2 26 08/09/2012 0510   GLUCOSE 117* 08/09/2012 0510   BUN 8 08/09/2012 0510   CREATININE 0.41* 08/09/2012 0510   CALCIUM 8.3* 08/09/2012 0510   PROT 6.4 08/09/2012 0510   ALBUMIN 3.1* 08/09/2012 0510   AST 70* 08/09/2012 0510   ALT 176* 08/09/2012 0510   ALKPHOS 169* 08/09/2012 0510   BILITOT 0.4 08/09/2012 0510   GFRNONAA >90 08/09/2012 0510   GFRAA >90 08/09/2012 0510   Lipase     Component Value Date/Time   LIPASE 2791* 08/09/2012 0510       Studies/Results: US Abdomen Complete  08/08/2012  *RADIOLOGY REPORT*  Clinical Data:  Abdominal pain and chest pain.  COMPLETE ABDOMINAL ULTRASOUND  Comparison:  None.  Findings:  Gallbladder:  Numerous small shadowing gallstones and moderate echogenic sludge within the  gallbladder.  Circumferential gallbladder wall thickening up to approximately 6 mm.  Largest gallstone is on the order of 1.1 cm.  No pericholecystic fluid. Negative sonographic Murphy's sign according to the ultrasound technologist.  Common bile duct:  Dilated up to approximately 11 mm.  Sludge and small stones suspected within the common bile duct.  Liver:  Normal size and echotexture without focal parenchymal abnormality.  Patent portal vein with hepatopetal flow.  IVC:  Patent.  Pancreas:  Although the pancreas is difficult to visualize in its entirety, no focal pancreatic abnormality is identified.  Spleen:  Normal size and echotexture without focal parenchymal abnormality.  Right Kidney:  No hydronephrosis.  Well-preserved cortex.  No shadowing calculi.  Normal size and parenchymal echotexture without focal abnormalities.  Approximately 10.4 cm in length.  Left Kidney:  No hydronephrosis.  Well-preserved cortex.  No shadowing calculi.  Normal size and parenchymal echotexture without focal abnormalities.  Approximately 9.8 cm in length.  Abdominal aorta:  Normal in caliber throughout its visualized course in the abdomen.  Obscured distally by overlying bowel gas.  IMPRESSION:  1.  Cholelithiasis, gallbladder sludge, and gallbladder wall thickening consistent with  cholecystitis. 2.  Mild extrahepatic biliary ductal dilation with evidence of sludge and small stones in the common duct. 3.  Otherwise normal examination with a caveat that the distal abdominal aorta was obscured by overlying bowel gas and was therefore not evaluated.   Original Report Authenticated By: Hulan Saas, M.D.     Anti-infectives: Anti-infectives   None       Assessment/Plan Gallstone Pancreatitis 1.  Cont conservative treatment:  IVF, NPO, pain control, antiemetics 2.  Will advance diet when pain is better controlled 3.  Lipase is still too high to take the patient to the OR for a lap chole, but will need one this  admission 4.  Repeat labs tomorrow 5.  Ambulate and IS 6.  VTE SCD's and Heparin    LOS: 1 day    DORT, Avrielle Fry 08/09/2012, 9:15 AM Pager: [phone]

## 2012-08-10 LAB — COMPREHENSIVE METABOLIC PANEL
ALT: 106 U/L — ABNORMAL HIGH (ref 0–35)
Albumin: 2.9 g/dL — ABNORMAL LOW (ref 3.5–5.2)
Alkaline Phosphatase: 150 U/L — ABNORMAL HIGH (ref 39–117)
BUN: 6 mg/dL (ref 6–23)
Chloride: 105 mEq/L (ref 96–112)
GFR calc Af Amer: >90 mL/min (ref >90)
Glucose, Bld: 112 mg/dL — ABNORMAL HIGH (ref 70–99)
Potassium: 3.6 mEq/L (ref 3.5–5.1)
Sodium: 136 mEq/L (ref 135–145)
Total Bilirubin: 0.6 mg/dL (ref 0.3–1.2)

## 2012-08-10 LAB — CBC
HCT: 37.7 % (ref 36.0–46.0)
Hemoglobin: 13.3 g/dL (ref 12.0–15.0)
MCV: 89.1 fL (ref 78.0–100.0)
RBC: 4.23 MIL/uL (ref 3.87–5.11)
WBC: 10.1 10*3/uL (ref 4.0–10.5)

## 2012-08-10 NOTE — Progress Notes (Signed)
Feels better, low grade fever likely pancreatitis, less tender, maybe tomorrow for lap chole if better

## 2012-08-10 NOTE — Progress Notes (Signed)
Subjective: Feels somewhat better, still tender.    Objective: Vital signs in last 24 hours: Temp:  [98.9 F (37.2 C)-101.1 F (38.4 C)] 100.2 F (37.9 C) (04/10 0502) Pulse Rate:  [90-114] 109 (04/10 0502) Resp:  [16-18] 18 (04/10 0502) BP: (104-113)/(66-76) 112/71 mmHg (04/10 0502) SpO2:  [99 %-100 %] 100 % (04/10 0502) Last BM Date: 08/07/12 NPO, TM 101.1, 2100 hours, 100.2 0500 hours., Lipase down to 858. LFT's improving also, WBC is stable,  Intake/Output from previous day: 04/09 0701 - 04/10 0700 In: 1098.3 [I.V.:1098.3] Out: 1875 [Urine:1875] Intake/Output this shift:    General appearance: alert, cooperative and no distress GI: soft, warm, tender, more in the mid epigastric area.  No distension, no flatus, no BM  Lab Results:   Recent Labs  08/09/12 0510 08/10/12 0600  WBC 7.5 10.1  HGB 13.6 13.3  HCT 37.9 37.7  PLT 198 164    BMET  Recent Labs  08/09/12 0510 08/10/12 0600  NA 139 136  K 3.4* 3.6  CL 105 105  CO2 26 23  GLUCOSE 117* 112*  BUN 8 6  CREATININE 0.41* 0.48*  CALCIUM 8.3* 8.7   PT/INR No results found for this basename: LABPROT, INR,  in the last 72 hours   Recent Labs Lab 08/08/12 0739 08/09/12 0510 08/10/12 0600  AST 419* 70* 40*  ALT 328* 176* 106*  ALKPHOS 227* 169* 150*  BILITOT 0.4 0.4 0.6  PROT 7.6 6.4 6.5  ALBUMIN 3.7 3.1* 2.9*     Lipase     Component Value Date/Time   LIPASE 858* 08/10/2012 0600                                       2791                                                                 08/09/12                                      >3000                                                               08/08/12    Studies/Results: US Abdomen Complete  08/08/2012  *RADIOLOGY REPORT*  Clinical Data:  Abdominal pain and chest pain.  COMPLETE ABDOMINAL ULTRASOUND  Comparison:  None.  Findings:  Gallbladder:  Numerous small shadowing gallstones and moderate echogenic sludge within the gallbladder.   Circumferential gallbladder wall thickening up to approximately 6 mm.  Largest gallstone is on the order of 1.1 cm.  No pericholecystic fluid. Negative sonographic Murphy's sign according to the ultrasound technologist.  Common bile duct:  Dilated up to approximately 11 mm.  Sludge and small stones suspected within the common bile duct.  Liver:  Normal size and echotexture without focal parenchymal abnormality.  Patent portal vein with hepatopetal flow.  IVC:  Patent.  Pancreas:  Although the pancreas is difficult to visualize in its entirety, no focal pancreatic abnormality is identified.  Spleen:  Normal size and echotexture without focal parenchymal abnormality.  Right Kidney:  No hydronephrosis.  Well-preserved cortex.  No shadowing calculi.  Normal size and parenchymal echotexture without focal abnormalities.  Approximately 10.4 cm in length.  Left Kidney:  No hydronephrosis.  Well-preserved cortex.  No shadowing calculi.  Normal size and parenchymal echotexture without focal abnormalities.  Approximately 9.8 cm in length.  Abdominal aorta:  Normal in caliber throughout its visualized course in the abdomen.  Obscured distally by overlying bowel gas.  IMPRESSION:  1.  Cholelithiasis, gallbladder sludge, and gallbladder wall thickening consistent with cholecystitis. 2.  Mild extrahepatic biliary ductal dilation with evidence of sludge and small stones in the common duct. 3.  Otherwise normal examination with a caveat that the distal abdominal aorta was obscured by overlying bowel gas and was therefore not evaluated.   Original Report Authenticated By: Hulan Saas, M.D.     Medications: . antiseptic oral rinse  15 mL Mouth Rinse q12n4p  . chlorhexidine  15 mL Mouth Rinse BID  . heparin  5,000 Units Subcutaneous Q8H  . pantoprazole (PROTONIX) IV  40 mg Intravenous QHS   . dextrose 5 % and 0.45 % NaCl with KCl 20 mEq/L 100 mL/hr at 08/09/12 2253    Assessment/Plan Gallstone Pancreatitis Post partum  28 days ( still using breast pump) Hx of asthma,  Anemia  Plan;  Continue conservative Rx.  IV at 20 ML now.  Will increase and recheck labs in AM.      LOS: 2 days    Tammy Wickliffe 08/10/2012

## 2012-08-11 ENCOUNTER — Encounter (HOSPITAL_COMMUNITY): Payer: Self-pay | Admitting: Anesthesiology

## 2012-08-11 ENCOUNTER — Encounter (INDEPENDENT_AMBULATORY_CARE_PROVIDER_SITE_OTHER): Payer: Self-pay

## 2012-08-11 ENCOUNTER — Inpatient Hospital Stay (HOSPITAL_COMMUNITY): Payer: Medicaid Other | Admitting: Anesthesiology

## 2012-08-11 ENCOUNTER — Encounter (HOSPITAL_COMMUNITY): Admission: EM | Disposition: A | Discharge status: Home / Self Care | Payer: Self-pay | Source: Home / Self Care

## 2012-08-11 DIAGNOSIS — K8010 Calculus of gallbladder with chronic cholecystitis without obstruction: Secondary | ICD-10-CM

## 2012-08-11 HISTORY — PX: CHOLECYSTECTOMY: SHX55

## 2012-08-11 LAB — CBC
Hemoglobin: 12.8 g/dL (ref 12.0–15.0)
MCH: 30.7 pg (ref 26.0–34.0)
Platelets: 154 10*3/uL (ref 150–400)
RBC: 4.17 MIL/uL (ref 3.87–5.11)
WBC: 8.7 10*3/uL (ref 4.0–10.5)

## 2012-08-11 LAB — COMPREHENSIVE METABOLIC PANEL
ALT: 89 U/L — ABNORMAL HIGH (ref 0–35)
Calcium: 8.9 mg/dL (ref 8.4–10.5)
Creatinine, Ser: 0.49 mg/dL — ABNORMAL LOW (ref 0.50–1.10)
GFR calc Af Amer: >90 mL/min (ref >90)
GFR calc non Af Amer: >90 mL/min (ref >90)
Glucose, Bld: 96 mg/dL (ref 70–99)
Sodium: 136 mEq/L (ref 135–145)
Total Protein: 6.6 g/dL (ref 6.0–8.3)

## 2012-08-11 LAB — SURGICAL PCR SCREEN
MRSA, PCR: NEGATIVE
Staphylococcus aureus: NEGATIVE

## 2012-08-11 SURGERY — LAPAROSCOPIC CHOLECYSTECTOMY
Anesthesia: General | Site: Abdomen | Wound class: Clean Contaminated

## 2012-08-11 MED ORDER — SODIUM CHLORIDE 0.9 % IR SOLN
Status: DC | PRN
  Administered 2012-08-11: 1000 mL

## 2012-08-11 MED ORDER — MIDAZOLAM HCL 5 MG/5ML IJ SOLN
INTRAMUSCULAR | Status: DC | PRN
  Administered 2012-08-11: 2 mg via INTRAVENOUS

## 2012-08-11 MED ORDER — HYDROMORPHONE HCL PF 1 MG/ML IJ SOLN
INTRAMUSCULAR | Status: AC
  Administered 2012-08-11: 0.25 mg via INTRAVENOUS
  Filled 2012-08-11: qty 1

## 2012-08-11 MED ORDER — SODIUM CHLORIDE 0.9 % IV SOLN
INTRAVENOUS | Status: DC | PRN
  Administered 2012-08-11: 13:00:00

## 2012-08-11 MED ORDER — FENTANYL CITRATE 0.05 MG/ML IJ SOLN
INTRAMUSCULAR | Status: DC | PRN
  Administered 2012-08-11 (×3): 50 ug via INTRAVENOUS
  Administered 2012-08-11: 150 ug via INTRAVENOUS
  Administered 2012-08-11: 50 ug via INTRAVENOUS

## 2012-08-11 MED ORDER — LACTATED RINGERS IV SOLN
INTRAVENOUS | Status: DC
  Administered 2012-08-11: 12:00:00 via INTRAVENOUS

## 2012-08-11 MED ORDER — ROCURONIUM BROMIDE 100 MG/10ML IV SOLN
INTRAVENOUS | Status: DC | PRN
  Administered 2012-08-11: 45 mg via INTRAVENOUS

## 2012-08-11 MED ORDER — PROMETHAZINE HCL 25 MG/ML IJ SOLN
INTRAMUSCULAR | Status: AC
  Administered 2012-08-11: 6.25 mg via INTRAVENOUS
  Filled 2012-08-11: qty 1

## 2012-08-11 MED ORDER — DEXTROSE 5 % IV SOLN
2.0000 g | Freq: Once | INTRAVENOUS | Status: AC
  Administered 2012-08-11: 2 g via INTRAVENOUS
  Filled 2012-08-11: qty 2

## 2012-08-11 MED ORDER — ONDANSETRON HCL 4 MG/2ML IJ SOLN
INTRAMUSCULAR | Status: DC | PRN
  Administered 2012-08-11: 4 mg via INTRAVENOUS

## 2012-08-11 MED ORDER — LACTATED RINGERS IV SOLN
INTRAVENOUS | Status: DC | PRN
  Administered 2012-08-11 (×2): via INTRAVENOUS

## 2012-08-11 MED ORDER — PROMETHAZINE HCL 25 MG/ML IJ SOLN: 6.2500 mg | INTRAMUSCULAR | Status: DC | PRN

## 2012-08-11 MED ORDER — KCL IN DEXTROSE-NACL 20-5-0.45 MEQ/L-%-% IV SOLN
INTRAVENOUS | Status: AC
  Administered 2012-08-11: 18:00:00
  Filled 2012-08-11: qty 1000

## 2012-08-11 MED ORDER — BUPIVACAINE-EPINEPHRINE 0.25% -1:200000 IJ SOLN
INTRAMUSCULAR | Status: AC
  Filled 2012-08-11: qty 1

## 2012-08-11 MED ORDER — NEOSTIGMINE METHYLSULFATE 1 MG/ML IJ SOLN
INTRAMUSCULAR | Status: DC | PRN
  Administered 2012-08-11: 4 mg via INTRAVENOUS

## 2012-08-11 MED ORDER — HYDROMORPHONE HCL PF 1 MG/ML IJ SOLN
0.2500 mg | INTRAMUSCULAR | Status: DC | PRN
  Administered 2012-08-11: 0.25 mg via INTRAVENOUS

## 2012-08-11 MED ORDER — PROPOFOL 10 MG/ML IV BOLUS
INTRAVENOUS | Status: DC | PRN
  Administered 2012-08-11: 180 mg via INTRAVENOUS

## 2012-08-11 MED ORDER — DEXTROSE 5 % IV SOLN
INTRAVENOUS | Status: AC
  Filled 2012-08-11 (×2): qty 1

## 2012-08-11 MED ORDER — MORPHINE SULFATE 2 MG/ML IJ SOLN
INTRAMUSCULAR | Status: AC
  Administered 2012-08-11: 2 mg via INTRAMUSCULAR
  Filled 2012-08-11: qty 1

## 2012-08-11 MED ORDER — HYDROMORPHONE HCL PF 1 MG/ML IJ SOLN
0.5000 mg | INTRAMUSCULAR | Status: DC | PRN
  Administered 2012-08-11 – 2012-08-13 (×11): 0.5 mg via INTRAVENOUS
  Filled 2012-08-11 (×11): qty 1

## 2012-08-11 MED ORDER — HEMOSTATIC AGENTS (NO CHARGE) OPTIME
TOPICAL | Status: DC | PRN
  Administered 2012-08-11: 1 via TOPICAL

## 2012-08-11 MED ORDER — 0.9 % SODIUM CHLORIDE (POUR BTL) OPTIME
TOPICAL | Status: DC | PRN
  Administered 2012-08-11: 1000 mL

## 2012-08-11 MED ORDER — BUPIVACAINE-EPINEPHRINE 0.25% -1:200000 IJ SOLN
INTRAMUSCULAR | Status: DC | PRN
  Administered 2012-08-11: 15 mL

## 2012-08-11 MED ORDER — ARTIFICIAL TEARS OP OINT
TOPICAL_OINTMENT | OPHTHALMIC | Status: DC | PRN
  Administered 2012-08-11: 1 via OPHTHALMIC

## 2012-08-11 MED ORDER — GLYCOPYRROLATE 0.2 MG/ML IJ SOLN
INTRAMUSCULAR | Status: DC | PRN
  Administered 2012-08-11: 0.6 mg via INTRAVENOUS

## 2012-08-11 MED ORDER — LIDOCAINE HCL (CARDIAC) 20 MG/ML IV SOLN
INTRAVENOUS | Status: DC | PRN
  Administered 2012-08-11: 60 mg via INTRAVENOUS

## 2012-08-11 SURGICAL SUPPLY — 44 items
APPLIER CLIP 5 13 M/L LIGAMAX5 (MISCELLANEOUS) ×3
BLADE SURG ROTATE 9660 (MISCELLANEOUS) IMPLANT
CANISTER SUCTION 2500CC (MISCELLANEOUS) ×3 IMPLANT
CHLORAPREP W/TINT 26ML (MISCELLANEOUS) ×3 IMPLANT
CLIP APPLIE 5 13 M/L LIGAMAX5 (MISCELLANEOUS) ×2 IMPLANT
CLOTH BEACON ORANGE TIMEOUT ST (SAFETY) ×3 IMPLANT
COVER MAYO STAND STRL (DRAPES) ×3 IMPLANT
COVER SURGICAL LIGHT HANDLE (MISCELLANEOUS) ×3 IMPLANT
DECANTER SPIKE VIAL GLASS SM (MISCELLANEOUS) IMPLANT
DERMABOND ADVANCED (GAUZE/BANDAGES/DRESSINGS) ×1
DERMABOND ADVANCED .7 DNX12 (GAUZE/BANDAGES/DRESSINGS) ×2 IMPLANT
DRAPE C-ARM 42X72 X-RAY (DRAPES) ×3 IMPLANT
ELECT REM PT RETURN 9FT ADLT (ELECTROSURGICAL) ×3
ELECTRODE REM PT RTRN 9FT ADLT (ELECTROSURGICAL) ×2 IMPLANT
ENDOLOOP SUT PDS II  0 18 (SUTURE) ×1
ENDOLOOP SUT PDS II 0 18 (SUTURE) ×2 IMPLANT
GLOVE BIO SURGEON STRL SZ7 (GLOVE) ×6 IMPLANT
GLOVE BIOGEL PI IND STRL 7.0 (GLOVE) ×2 IMPLANT
GLOVE BIOGEL PI IND STRL 7.5 (GLOVE) ×2 IMPLANT
GLOVE BIOGEL PI IND STRL 8 (GLOVE) ×2 IMPLANT
GLOVE BIOGEL PI INDICATOR 7.0 (GLOVE) ×1
GLOVE BIOGEL PI INDICATOR 7.5 (GLOVE) ×1
GLOVE BIOGEL PI INDICATOR 8 (GLOVE) ×1
GLOVE ECLIPSE 7.0 STRL STRAW (GLOVE) ×3 IMPLANT
GLOVE SURG SS PI 7.0 STRL IVOR (GLOVE) ×6 IMPLANT
GOWN STRL NON-REIN LRG LVL3 (GOWN DISPOSABLE) ×12 IMPLANT
HEMOSTAT SNOW SURGICEL 2X4 (HEMOSTASIS) ×3 IMPLANT
KIT BASIN OR (CUSTOM PROCEDURE TRAY) ×3 IMPLANT
KIT ROOM TURNOVER OR (KITS) ×3 IMPLANT
NS IRRIG 1000ML POUR BTL (IV SOLUTION) ×3 IMPLANT
PAD ARMBOARD 7.5X6 YLW CONV (MISCELLANEOUS) ×3 IMPLANT
POUCH SPECIMEN RETRIEVAL 10MM (ENDOMECHANICALS) ×3 IMPLANT
SCISSORS LAP 5X35 DISP (ENDOMECHANICALS) IMPLANT
SET CHOLANGIOGRAPH 5 50 .035 (SET/KITS/TRAYS/PACK) ×3 IMPLANT
SET IRRIG TUBING LAPAROSCOPIC (IRRIGATION / IRRIGATOR) ×3 IMPLANT
SLEEVE ENDOPATH XCEL 5M (ENDOMECHANICALS) ×6 IMPLANT
SPECIMEN JAR SMALL (MISCELLANEOUS) ×3 IMPLANT
SUT MNCRL AB 4-0 PS2 18 (SUTURE) ×6 IMPLANT
SUT VICRYL 0 UR6 27IN ABS (SUTURE) ×3 IMPLANT
TOWEL OR 17X24 6PK STRL BLUE (TOWEL DISPOSABLE) IMPLANT
TOWEL OR 17X26 10 PK STRL BLUE (TOWEL DISPOSABLE) ×3 IMPLANT
TRAY LAPAROSCOPIC (CUSTOM PROCEDURE TRAY) ×3 IMPLANT
TROCAR XCEL BLUNT TIP 100MML (ENDOMECHANICALS) ×3 IMPLANT
TROCAR XCEL NON-BLD 5MMX100MML (ENDOMECHANICALS) ×3 IMPLANT

## 2012-08-11 NOTE — Progress Notes (Signed)
Lunch relief by MA Camelle Henkels RN 

## 2012-08-11 NOTE — Anesthesia Preprocedure Evaluation (Signed)
Anesthesia Evaluation  Patient identified by MRN, date of birth, ID band Patient awake    Reviewed: Allergy & Precautions, H&P , NPO status , Patient's Chart, lab work & pertinent test results  Airway Mallampati: II      Dental   Pulmonary asthma ,  breath sounds clear to auscultation        Cardiovascular negative cardio ROS  Rhythm:Regular Rate:Normal     Neuro/Psych    GI/Hepatic Neg liver ROS, Abdominal pain with n/v.   Endo/Other  negative endocrine ROS  Renal/GU negative Renal ROS     Musculoskeletal   Abdominal   Peds  Hematology   Anesthesia Other Findings   Reproductive/Obstetrics                           Anesthesia Physical Anesthesia Plan  ASA: II  Anesthesia Plan: General   Post-op Pain Management:    Induction: Intravenous  Airway Management Planned: Oral ETT  Additional Equipment:   Intra-op Plan:   Post-operative Plan:   Informed Consent: I have reviewed the patients History and Physical, chart, labs and discussed the procedure including the risks, benefits and alternatives for the proposed anesthesia with the patient or authorized representative who has indicated his/her understanding and acceptance.   Dental advisory given  Plan Discussed with: CRNA, Anesthesiologist and Surgeon  Anesthesia Plan Comments:         Anesthesia Quick Evaluation

## 2012-08-11 NOTE — Progress Notes (Signed)
  Subjective: Feels much better, still NPO, +BM now.    Objective: Vital signs in last 24 hours: Temp:  [99.3 F (37.4 C)-101.1 F (38.4 C)] 99.3 F (37.4 C) (04/11 0522) Pulse Rate:  [96-114] 96 (04/11 0522) Resp:  [16-17] 16 (04/11 0522) BP: (101-115)/(66-71) 112/71 mmHg (04/11 0522) SpO2:  [98 %-99 %] 99 % (04/11 0522) Last BM Date: 08/07/12  NPO, Temp spike at 10PM again to 101.1,WBC is better, lipase down to 125, LFT's continue to improve Intake/Output from previous day: 04/10 0701 - 04/11 0700 In: 2737 [I.V.:2737] Out: -  Intake/Output this shift:    General appearance: alert, cooperative and no distress GI: soft, non-tender; bowel sounds normal; no masses,  no organomegaly  Lab Results:   Recent Labs  08/10/12 0600 08/11/12 0525  WBC 10.1 8.7  HGB 13.3 12.8  HCT 37.7 37.3  PLT 164 154    BMET  Recent Labs  08/10/12 0600 08/11/12 0525  NA 136 136  K 3.6 3.4*  CL 105 104  CO2 23 24  GLUCOSE 112* 96  BUN 6 7  CREATININE 0.48* 0.49*  CALCIUM 8.7 8.9   PT/INR No results found for this basename: LABPROT, INR,  in the last 72 hours   Recent Labs Lab 08/08/12 0739 08/09/12 0510 08/10/12 0600 08/11/12 0525  AST 419* 70* 40* 39*  ALT 328* 176* 106* 89*  ALKPHOS 227* 169* 150* 165*  BILITOT 0.4 0.4 0.6 0.6  PROT 7.6 6.4 6.5 6.6  ALBUMIN 3.7 3.1* 2.9* 2.8*     Lipase     Component Value Date/Time   LIPASE 125* 08/11/2012 0525     Studies/Results: No results found.  Medications: . antiseptic oral rinse  15 mL Mouth Rinse q12n4p  . chlorhexidine  15 mL Mouth Rinse BID  . heparin  5,000 Units Subcutaneous Q8H  . pantoprazole (PROTONIX) IV  40 mg Intravenous QHS    Assessment/Plan Gallstone Pancreatitis  Post partum 28 days ( still using breast pump)  Hx of asthma,  Anemia  Plan:  Much better, still NPO, husband would like her to have surgery today.  Will discuss with Dr. Dwain Sarna.     LOS: 3 days     Jill Ray 08/11/2012

## 2012-08-11 NOTE — Preoperative (Signed)
Beta Blockers   Reason not to administer Beta Blockers:Not Applicable 

## 2012-08-11 NOTE — Progress Notes (Signed)
  Subjective: Feels much better, a little bit of a fever, no ab pain  Objective: Vital signs in last 24 hours: Temp:  [99.3 F (37.4 C)-101.1 F (38.4 C)] 99.3 F (37.4 C) (04/11 0522) Pulse Rate:  [96-114] 96 (04/11 0522) Resp:  [16-17] 16 (04/11 0522) BP: (101-115)/(66-71) 112/71 mmHg (04/11 0522) SpO2:  [98 %-99 %] 99 % (04/11 0522) Last BM Date: 08/07/12  Intake/Output from previous day: 04/10 0701 - 04/11 0700 In: 2737 [I.V.:2737] Out: -  Intake/Output this shift:    GI: soft nontender today  Lab Results:   Recent Labs  08/10/12 0600 08/11/12 0525  WBC 10.1 8.7  HGB 13.3 12.8  HCT 37.7 37.3  PLT 164 154   BMET  Recent Labs  08/10/12 0600 08/11/12 0525  NA 136 136  K 3.6 3.4*  CL 105 104  CO2 23 24  GLUCOSE 112* 96  BUN 6 7  CREATININE 0.48* 0.49*  CALCIUM 8.7 8.9   PT/INR No results found for this basename: LABPROT, INR,  in the last 72 hours ABG No results found for this basename: PHART, PCO2, PO2, HCO3,  in the last 72 hours  Studies/Results: No results found.  Anti-infectives: Anti-infectives   None      Assessment/Plan: Gsp resolved Lap chole today I discussed the procedure in detail. We discussed the risks and benefits of a laparoscopic cholecystectomy and possible cholangiogram including, but not limited to bleeding, infection, injury to surrounding structures such as the intestine or liver, bile leak, retained gallstones, need to convert to an open procedure, prolonged diarrhea, blood clots such as  DVT, common bile duct injury, anesthesia risks, and possible need for additional procedures.  The likelihood of improvement in symptoms and return to the patient's normal status is good. We discussed the typical post-operative recovery course.   [facility] 08/11/2012

## 2012-08-11 NOTE — Anesthesia Procedure Notes (Signed)
Procedure Name: Intubation Date/Time: 08/11/2012 12:40 PM Performed by: Angelica Pou Pre-anesthesia Checklist: Patient identified, Timeout performed, Emergency Drugs available, Suction available and Patient being monitored Patient Re-evaluated:Patient Re-evaluated prior to inductionOxygen Delivery Method: Circle system utilized Preoxygenation: Pre-oxygenation with 100% oxygen Intubation Type: IV induction Ventilation: Mask ventilation without difficulty Laryngoscope Size: Mac and 4 Grade View: Grade I Tube type: Oral Tube size: 7.5 mm Number of attempts: 1 Airway Equipment and Method: Stylet Placement Confirmation: ETT inserted through vocal cords under direct vision,  breath sounds checked- equal and bilateral and positive ETCO2 Secured at: 23 cm Tube secured with: Tape Dental Injury: Teeth and Oropharynx as per pre-operative assessment

## 2012-08-11 NOTE — Anesthesia Postprocedure Evaluation (Signed)
Anesthesia Post Note  Patient: Malajah Oceguera  Procedure(s) Performed: Procedure(s) (LRB): LAPAROSCOPIC CHOLECYSTECTOMY (N/A)  Anesthesia type: General  Patient location: PACU  Post pain: Pain level controlled  Post assessment: Patient's Cardiovascular Status Stable  Last Vitals:  Filed Vitals:   08/11/12 1543  BP:   Pulse: 89  Temp:   Resp: 22    Post vital signs: Reviewed and stable  Level of consciousness: alert  Complications: No apparent anesthesia complications

## 2012-08-11 NOTE — Transfer of Care (Signed)
Immediate Anesthesia Transfer of Care Note  Patient: Jill Ray  Procedure(s) Performed: Procedure(s): LAPAROSCOPIC CHOLECYSTECTOMY (N/A)  Patient Location: PACU  Anesthesia Type:General  Level of Consciousness: awake, alert  and oriented  Airway & Oxygen Therapy: Patient Spontanous Breathing and Patient connected to face mask oxygen  Post-op Assessment: Report given to PACU RN  Post vital signs: Reviewed and stable  Complications: No apparent anesthesia complications

## 2012-08-11 NOTE — Op Note (Signed)
Preoperative diagnosis: Gallstone pancreatitis Postoperative diagnosis: Same as above Procedure: Laparoscopic cholecystectomy Surgeon: Dr. Harden Mo Asst.: Barnetta Chapel PA Estimated blood loss: Minimal Specimens: Gallbladder and contents to pathology Drains: None Complications: None Sponge count correct x2 at end of operation Disposition to recovery stable  Indications: This is a 32 year old female who presented with about a third episode of what sounds to be gallstone pancreatitis. She's taken several days resolve her pancreatitis. She is about 3 weeks postpartum at this point as well. Once her pancreatitis had resolved we discussed proceeding to the operating room for a laparoscopic cholecystectomy and an attempt at cholangiogram. I discussed the risks and benefits with she and her husband.  Procedure: After informed consent was obtained the patient was taken to the operating room. She was administered cefoxitin. Sequential compression devices were on her legs. She was then placed under general endotracheal anesthesia without complication. Her abdomen was prepped and draped in the standard sterile surgical fashion. A surgical timeout was performed.  I made a vertical incision below her umbilicus. Her abdomen was still very lax due to her recent pregnancy. I incised the fascia and entered the peritoneum bluntly. Her abdominal wall was very thin and weak in this position. I then placed a 0 Vicryl pursestring suture to the fascia. I inserted a Hassan trocar and insufflated the abdomen to 15 mm mercury pressure. I then placed 3 further 5 mm trocars in the epigastrium and right upper quadrant under direct vision without complication. The gallbladder was then retracted cephalad. She did appear to have acute cholecystitis. I then retracted the gallbladder cephalad and lateral. She had a lot of inflammation in the triangle. I then was able to obtain essentially a critical view of safety both with the  cystic duct and cystic artery. The cystic duct was very large. I made a ductotomy. I milked a lot of stones out of her cystic duct and evacuated these.  I was going to attempt cholangiogram but I was concerned that this duct was going to rip. Eventually I had good flow of bile back and I elected to place 2 clips on the duct. I divided it as it was already starting to rip when I was grasping it. I then placed a Vicryl Endoloop around the duct. I then clipped the artery. I divided this. I then removed the gallbladder from the liver bed with some difficulty as it was very inflamed. I then placed an Endo Catch bag and removed from the umbilicus. Hemostasis was then observed. Irrigation was performed until this was clear. I then placed a piece of Surgicel snow when the liver bed. I then removed the umbilical trocar and tied the stitch down. I did place an additional 0 Vicryl purse string through the fascia. This completely obliterated the defect. I then desufflated the abdomen and removed all my trocars. I then closed using 4-0 Monocryl and Dermabond. She tolerated this well was extubated in the operating room.

## 2012-08-12 ENCOUNTER — Encounter (HOSPITAL_COMMUNITY): Payer: Self-pay | Admitting: Gastroenterology

## 2012-08-12 LAB — COMPREHENSIVE METABOLIC PANEL
ALT: 321 U/L — ABNORMAL HIGH (ref 0–35)
Alkaline Phosphatase: 595 U/L — ABNORMAL HIGH (ref 39–117)
BUN: 5 mg/dL — ABNORMAL LOW (ref 6–23)
CO2: 27 mEq/L (ref 19–32)
Chloride: 103 mEq/L (ref 96–112)
GFR calc Af Amer: >90 mL/min (ref >90)
Glucose, Bld: 115 mg/dL — ABNORMAL HIGH (ref 70–99)
Potassium: 3.5 mEq/L (ref 3.5–5.1)
Sodium: 137 mEq/L (ref 135–145)
Total Bilirubin: 2.7 mg/dL — ABNORMAL HIGH (ref 0.3–1.2)

## 2012-08-12 LAB — CBC
HCT: 34.4 % — ABNORMAL LOW (ref 36.0–46.0)
MCHC: 35.2 g/dL (ref 30.0–36.0)
Platelets: 188 10*3/uL (ref 150–400)
RDW: 12.7 % (ref 11.5–15.5)
WBC: 7.7 10*3/uL (ref 4.0–10.5)

## 2012-08-12 MED ORDER — SODIUM CHLORIDE 0.9 % IV SOLN: INTRAVENOUS | Status: DC

## 2012-08-12 NOTE — Progress Notes (Signed)
1 Day Post-Op  Subjective: No issues  Objective: Vital signs in last 24 hours: Temp:  [97.8 F (36.6 C)-99.5 F (37.5 C)] 98.1 F (36.7 C) (04/12 0540) Pulse Rate:  [75-104] 82 (04/12 0540) Resp:  [16-22] 16 (04/12 0540) BP: (113-125)/(71-78) 119/76 mmHg (04/12 0540) SpO2:  [92 %-100 %] 100 % (04/12 0540) Last BM Date: 08/07/12  Intake/Output from previous day: 04/11 0701 - 04/12 0700 In: 2015 [P.O.:120; I.V.:1895] Out: 775 [Urine:750; Blood:25] Intake/Output this shift:    General appearance: alert, cooperative and no distress Resp: nonlabored Cardio: normal rate, regular GI: soft, appropriate incisional tenderness, ND, incisions okay  Lab Results:   Recent Labs  08/11/12 0525 08/12/12 0708  WBC 8.7 7.7  HGB 12.8 12.1  HCT 37.3 34.4*  PLT 154 188   BMET  Recent Labs  08/10/12 0600 08/11/12 0525  NA 136 136  K 3.6 3.4*  CL 105 104  CO2 23 24  GLUCOSE 112* 96  BUN 6 7  CREATININE 0.48* 0.49*  CALCIUM 8.7 8.9   PT/INR No results found for this basename: LABPROT, INR,  in the last 72 hours ABG No results found for this basename: PHART, PCO2, PO2, HCO3,  in the last 72 hours  Studies/Results: No results found.  Anti-infectives: Anti-infectives   Start     Dose/Rate Route Frequency Ordered Stop   08/11/12 1222  dextrose 5 % with cefOXitin (MEFOXIN) ADS Med    Comments:  KEY, KRISTOPHER: cabinet override      08/11/12 1222 08/12/12 0029   08/11/12 1030  [MAR Hold]  cefOXitin (MEFOXIN) 2 g in dextrose 5 % 50 mL IVPB     (On MAR Hold since 08/11/12 1137)   2 g 100 mL/hr over 30 Minutes Intravenous  Once 08/11/12 1019 08/11/12 1245      Assessment/Plan: s/p Procedure(s): LAPAROSCOPIC CHOLECYSTECTOMY (N/A) Awating LFT's.  If elevated, will likely need ERCP to evaluate.  otherwise advance diet and likely home tomorrow if LFT's continue to be okay  LOS: 4 days    Lodema Pilot DAVID 08/12/2012

## 2012-08-12 NOTE — Consult Note (Signed)
Reason for Consult: Probable CBD stone Referring Physician: Surgeon  Jill Ray is an 32 y.o. female.  HPI: Patient with resolved gallstone pancreatitis fairly quickly and laparoscopic cholecystectomy yesterday without Intra-Op cholangiogram unfortunately however her liver tests have spiked and we are consulted for consideration of ERCP. She's had no previous GI problems and has had 2 children and is doing fairly well from her surgery and her pain is different than what brought her to the hospital and she tolerated clear liquids yesterday Past Medical History  Diagnosis Date  . Asthma   . Urinary tract infection     GBS in urine current preg  . Anemia   . Umbilical hernia     hernia (reducible umbilical hernia) is present. Jill Ray 08/08/2012  . Gallstone pancreatitis 08/08/2012    Jill Ray 08/08/2012    Past Surgical History  Procedure Laterality Date  . No past surgeries      Family History  Problem Relation Age of Onset  . Other Neg Hx   . Asthma Maternal Grandfather     Social History:  reports that she has never smoked. She has never used smokeless tobacco. She reports that she does not drink alcohol or use illicit drugs.  Allergies:  Allergies  Allergen Reactions  . Levaquin (Levofloxacin In D5w) Itching    Medications: I have reviewed the patient's current medications.  Results for orders placed during the hospital encounter of 08/08/12 (from the past 48 hour(s))  COMPREHENSIVE METABOLIC PANEL     Status: Abnormal   Collection Time    08/11/12  5:25 AM      Result Value Range   Sodium 136  135 - 145 mEq/L   Potassium 3.4 (*) 3.5 - 5.1 mEq/L   Chloride 104  96 - 112 mEq/L   CO2 24  19 - 32 mEq/L   Glucose, Bld 96  70 - 99 mg/dL   BUN 7  6 - 23 mg/dL   Creatinine, Ser 4.54 (*) 0.50 - 1.10 mg/dL   Calcium 8.9  8.4 - 09.8 mg/dL   Total Protein 6.6  6.0 - 8.3 g/dL   Albumin 2.8 (*) 3.5 - 5.2 g/dL   AST 39 (*) 0 - 37 U/L   ALT 89 (*) 0 - 35 U/L   Alkaline  Phosphatase 165 (*) 39 - 117 U/L   Total Bilirubin 0.6  0.3 - 1.2 mg/dL   GFR calc non Af Amer >90  >90 mL/min   GFR calc Af Amer >90  >90 mL/min   Comment:            The eGFR has been calculated     using the CKD EPI equation.     This calculation has not been     validated in all clinical     situations.     eGFR's persistently     <90 mL/min signify     possible Chronic Kidney Disease.  LIPASE, BLOOD     Status: Abnormal   Collection Time    08/11/12  5:25 AM      Result Value Range   Lipase 125 (*) 11 - 59 U/L  CBC     Status: None   Collection Time    08/11/12  5:25 AM      Result Value Range   WBC 8.7  4.0 - 10.5 K/uL   RBC 4.17  3.87 - 5.11 MIL/uL   Hemoglobin 12.8  12.0 - 15.0 g/dL   HCT 11.9  14.7 -  46.0 %   MCV 89.4  78.0 - 100.0 fL   MCH 30.7  26.0 - 34.0 pg   MCHC 34.3  30.0 - 36.0 g/dL   RDW 16.1  09.6 - 04.5 %   Platelets 154  150 - 400 K/uL  SURGICAL PCR SCREEN     Status: None   Collection Time    08/11/12  9:23 AM      Result Value Range   MRSA, PCR NEGATIVE  NEGATIVE   Staphylococcus aureus NEGATIVE  NEGATIVE   Comment:            The Xpert SA Assay (FDA     approved for NASAL specimens     in patients over 33 years of age),     is one component of     a comprehensive surveillance     program.  Test performance has     been validated by [employer] for patients greater     than or equal to 44 year old.     It is not intended     to diagnose infection nor to     guide or monitor treatment.  LIPASE, BLOOD     Status: Abnormal   Collection Time    08/12/12  7:08 AM      Result Value Range   Lipase 246 (*) 11 - 59 U/L  COMPREHENSIVE METABOLIC PANEL     Status: Abnormal   Collection Time    08/12/12  7:08 AM      Result Value Range   Sodium 137  135 - 145 mEq/L   Potassium 3.5  3.5 - 5.1 mEq/L   Chloride 103  96 - 112 mEq/L   CO2 27  19 - 32 mEq/L   Glucose, Bld 115 (*) 70 - 99 mg/dL   BUN 5 (*) 6 - 23 mg/dL   Creatinine, Ser 4.09 (*)  0.50 - 1.10 mg/dL   Calcium 8.8  8.4 - 81.1 mg/dL   Total Protein 6.3  6.0 - 8.3 g/dL   Albumin 2.6 (*) 3.5 - 5.2 g/dL   AST 914 (*) 0 - 37 U/L   ALT 321 (*) 0 - 35 U/L   Alkaline Phosphatase 595 (*) 39 - 117 U/L   Total Bilirubin 2.7 (*) 0.3 - 1.2 mg/dL   GFR calc non Af Amer >90  >90 mL/min   GFR calc Af Amer >90  >90 mL/min   Comment:            The eGFR has been calculated     using the CKD EPI equation.     This calculation has not been     validated in all clinical     situations.     eGFR's persistently     <90 mL/min signify     possible Chronic Kidney Disease.  CBC     Status: Abnormal   Collection Time    08/12/12  7:08 AM      Result Value Range   WBC 7.7  4.0 - 10.5 K/uL   RBC 3.84 (*) 3.87 - 5.11 MIL/uL   Hemoglobin 12.1  12.0 - 15.0 g/dL   HCT 78.2 (*) 95.6 - 21.3 %   MCV 89.6  78.0 - 100.0 fL   MCH 31.5  26.0 - 34.0 pg   MCHC 35.2  30.0 - 36.0 g/dL   RDW 08.6  57.8 - 46.9 %   Platelets 188  150 -  400 K/uL    No results found.  ROS negative except above Blood pressure 109/72, pulse 101, temperature 99 F (37.2 C), temperature source Oral, resp. rate 18, last menstrual period 07/10/2012, SpO2 99.00%, currently breastfeeding. Physical Exam no acute distress lying comfortably in the bed lungs are clear heart regular rate and rhythm abdomen is soft mostly tender by incisions labs reviewed  Assessment/Plan: Probable residual CBD Plan: The risks benefits methods and success rate of ERCP as well as a diagram was drawn and discussed with the patient and her husband unfortunately there are still 5 cases to be done in the OR today and I tentatively scheduled her for tomorrow morning at 9 AM with further workup and plans pending those findings Trinh Sanjose E 08/12/2012, 12:18 PM

## 2012-08-13 ENCOUNTER — Inpatient Hospital Stay (HOSPITAL_COMMUNITY): Payer: Medicaid Other

## 2012-08-13 ENCOUNTER — Encounter (HOSPITAL_COMMUNITY): Admission: EM | Disposition: A | Discharge status: Home / Self Care | Payer: Self-pay | Source: Home / Self Care

## 2012-08-13 ENCOUNTER — Encounter (HOSPITAL_COMMUNITY): Payer: Self-pay | Admitting: Anesthesiology

## 2012-08-13 ENCOUNTER — Encounter (HOSPITAL_COMMUNITY): Payer: Self-pay | Admitting: Gastroenterology

## 2012-08-13 ENCOUNTER — Inpatient Hospital Stay (HOSPITAL_COMMUNITY): Payer: Medicaid Other | Admitting: Anesthesiology

## 2012-08-13 HISTORY — PX: ERCP: SHX5425

## 2012-08-13 HISTORY — PX: SPHINCTEROTOMY: SHX5544

## 2012-08-13 LAB — COMPREHENSIVE METABOLIC PANEL WITH GFR
ALT: 191 U/L — ABNORMAL HIGH (ref 0–35)
AST: 81 U/L — ABNORMAL HIGH (ref 0–37)
Albumin: 2.7 g/dL — ABNORMAL LOW (ref 3.5–5.2)
Alkaline Phosphatase: 483 U/L — ABNORMAL HIGH (ref 39–117)
BUN: 5 mg/dL — ABNORMAL LOW (ref 6–23)
CO2: 24 meq/L (ref 19–32)
Calcium: 9.3 mg/dL (ref 8.4–10.5)
Chloride: 103 meq/L (ref 96–112)
Creatinine, Ser: 0.45 mg/dL — ABNORMAL LOW (ref 0.50–1.10)
GFR calc Af Amer: >90 mL/min
GFR calc non Af Amer: >90 mL/min
Glucose, Bld: 106 mg/dL — ABNORMAL HIGH (ref 70–99)
Potassium: 3.8 meq/L (ref 3.5–5.1)
Sodium: 138 meq/L (ref 135–145)
Total Bilirubin: 0.6 mg/dL (ref 0.3–1.2)
Total Protein: 6.8 g/dL (ref 6.0–8.3)

## 2012-08-13 LAB — CBC WITH DIFFERENTIAL/PLATELET
Eosinophils Absolute: 0.1 10*3/uL (ref 0.0–0.7)
Eosinophils Relative: 2 % (ref 0–5)
HCT: 34.5 % — ABNORMAL LOW (ref 36.0–46.0)
Lymphocytes Relative: 16 % (ref 12–46)
Lymphs Abs: 1.1 10*3/uL (ref 0.7–4.0)
MCH: 31.2 pg (ref 26.0–34.0)
MCV: 88.9 fL (ref 78.0–100.0)
Monocytes Absolute: 0.7 10*3/uL (ref 0.1–1.0)
Monocytes Relative: 11 % (ref 3–12)
RBC: 3.88 MIL/uL (ref 3.87–5.11)
WBC: 6.8 10*3/uL (ref 4.0–10.5)

## 2012-08-13 LAB — TYPE AND SCREEN
ABO/RH(D): O POS
Antibody Screen: NEGATIVE

## 2012-08-13 SURGERY — ERCP, WITH INTERVENTION IF INDICATED
Anesthesia: General

## 2012-08-13 MED ORDER — LACTATED RINGERS IV SOLN
INTRAVENOUS | Status: DC | PRN
  Administered 2012-08-13 (×2): via INTRAVENOUS

## 2012-08-13 MED ORDER — ALBUTEROL SULFATE HFA 108 (90 BASE) MCG/ACT IN AERS
INHALATION_SPRAY | RESPIRATORY_TRACT | Status: DC | PRN
  Administered 2012-08-13: 3 via RESPIRATORY_TRACT

## 2012-08-13 MED ORDER — ACETAMINOPHEN 10 MG/ML IV SOLN
INTRAVENOUS | Status: AC
  Administered 2012-08-13: 11:00:00
  Filled 2012-08-13: qty 100

## 2012-08-13 MED ORDER — ARTIFICIAL TEARS OP OINT
TOPICAL_OINTMENT | OPHTHALMIC | Status: DC | PRN
  Administered 2012-08-13: 1 via OPHTHALMIC

## 2012-08-13 MED ORDER — DEXTROSE 5 % IV SOLN
INTRAVENOUS | Status: AC
  Filled 2012-08-13: qty 1

## 2012-08-13 MED ORDER — GLYCOPYRROLATE 0.2 MG/ML IJ SOLN
INTRAMUSCULAR | Status: DC | PRN
  Administered 2012-08-13: 0.4 mg via INTRAVENOUS

## 2012-08-13 MED ORDER — PROPOFOL 10 MG/ML IV BOLUS
INTRAVENOUS | Status: DC | PRN
  Administered 2012-08-13: 120 mg via INTRAVENOUS

## 2012-08-13 MED ORDER — HYDROMORPHONE HCL PF 1 MG/ML IJ SOLN: 0.2500 mg | INTRAMUSCULAR | Status: DC | PRN

## 2012-08-13 MED ORDER — MIDAZOLAM HCL 5 MG/5ML IJ SOLN
INTRAMUSCULAR | Status: DC | PRN
  Administered 2012-08-13: 2 mg via INTRAVENOUS

## 2012-08-13 MED ORDER — ONDANSETRON HCL 4 MG/2ML IJ SOLN: 4.0000 mg | Freq: Once | INTRAMUSCULAR | Status: DC | PRN

## 2012-08-13 MED ORDER — LIDOCAINE HCL (CARDIAC) 20 MG/ML IV SOLN
INTRAVENOUS | Status: DC | PRN
  Administered 2012-08-13: 30 mg via INTRAVENOUS

## 2012-08-13 MED ORDER — SODIUM CHLORIDE 0.9 % IV SOLN
INTRAVENOUS | Status: DC | PRN
  Administered 2012-08-13: 20:00:00

## 2012-08-13 MED ORDER — ACETAMINOPHEN 10 MG/ML IV SOLN
1000.0000 mg | Freq: Once | INTRAVENOUS | Status: AC | PRN
  Administered 2012-08-13: 1000 mg via INTRAVENOUS

## 2012-08-13 MED ORDER — FENTANYL CITRATE 0.05 MG/ML IJ SOLN
INTRAMUSCULAR | Status: DC | PRN
  Administered 2012-08-13: 50 ug via INTRAVENOUS
  Administered 2012-08-13: 100 ug via INTRAVENOUS

## 2012-08-13 MED ORDER — ONDANSETRON HCL 4 MG/2ML IJ SOLN
INTRAMUSCULAR | Status: DC | PRN
  Administered 2012-08-13: 4 mg via INTRAVENOUS

## 2012-08-13 MED ORDER — NEOSTIGMINE METHYLSULFATE 1 MG/ML IJ SOLN
INTRAMUSCULAR | Status: DC | PRN
  Administered 2012-08-13: 3 mg via INTRAVENOUS

## 2012-08-13 MED ORDER — ROCURONIUM BROMIDE 100 MG/10ML IV SOLN
INTRAVENOUS | Status: DC | PRN
  Administered 2012-08-13: 30 mg via INTRAVENOUS

## 2012-08-13 MED ORDER — DEXTROSE 5 % IV SOLN
1.0000 g | INTRAVENOUS | Status: DC | PRN
  Administered 2012-08-13: 1 g via INTRAVENOUS

## 2012-08-13 NOTE — Progress Notes (Signed)
Patient placed in prone position on OR table. Chest rolls in place, arms padded, lead under pelvis. Lawyer on

## 2012-08-13 NOTE — Anesthesia Preprocedure Evaluation (Signed)
Anesthesia Evaluation  Patient identified by MRN, date of birth, ID band Patient awake    Reviewed: Allergy & Precautions, H&P , NPO status , Patient's Chart, lab work & pertinent test results  Airway Mallampati: II TM Distance: >3 FB Neck ROM: Full    Dental  (+) Teeth Intact and Dental Advisory Given   Pulmonary  breath sounds clear to auscultation        Cardiovascular Rhythm:Regular     Neuro/Psych    GI/Hepatic   Endo/Other    Renal/GU      Musculoskeletal   Abdominal   Peds  Hematology   Anesthesia Other Findings   Reproductive/Obstetrics                           Anesthesia Physical Anesthesia Plan  ASA: II  Anesthesia Plan: General   Post-op Pain Management:    Induction: Intravenous  Airway Management Planned: Oral ETT  Additional Equipment:   Intra-op Plan:   Post-operative Plan: Extubation in OR  Informed Consent: I have reviewed the patients History and Physical, chart, labs and discussed the procedure including the risks, benefits and alternatives for the proposed anesthesia with the patient or authorized representative who has indicated his/her understanding and acceptance.   Dental advisory given  Plan Discussed with: CRNA, Anesthesiologist and Surgeon  Anesthesia Plan Comments: (S/P_ Lap Cholecystectomy 4/11 with probable CBD stone Mild asthma - inactive  Kipp Brood, MD)        Anesthesia Quick Evaluation

## 2012-08-13 NOTE — Progress Notes (Signed)
Jill Ray 8:10 AM  Subjective: Patient doing better than yesterday with decreased pain and no new complaints  Objective: Vital signs stable afebrile no acute distress lungs clear regular rate and rhythm abdomen is softer with decreased tenderness labs pending  Assessment: Probable CBD stones  Plan: Okay to proceed with anesthesia and ERCP today  [facility]

## 2012-08-13 NOTE — Preoperative (Signed)
Beta Blockers   Reason not to administer Beta Blockers:Not Applicable 

## 2012-08-13 NOTE — Transfer of Care (Signed)
Immediate Anesthesia Transfer of Care Note  Patient: Jill Ray  Procedure(s) Performed: Procedure(s): ENDOSCOPIC RETROGRADE CHOLANGIOPANCREATOGRAPHY (ERCP) (N/A)  Patient Location: PACU  Anesthesia Type:General  Level of Consciousness: awake, alert , oriented and sedated  Airway & Oxygen Therapy: Patient Spontanous Breathing and Patient connected to nasal cannula oxygen  Post-op Assessment: Report given to PACU RN, Post -op Vital signs reviewed and stable and Patient moving all extremities  Post vital signs: Reviewed and stable  Complications: No apparent anesthesia complications

## 2012-08-13 NOTE — Anesthesia Postprocedure Evaluation (Signed)
  Anesthesia Post-op Note  Patient: Jill Ray  Procedure(s) Performed: Procedure(s): ENDOSCOPIC RETROGRADE CHOLANGIOPANCREATOGRAPHY (ERCP) (N/A)  Patient Location: PACU  Anesthesia Type:General  Level of Consciousness: awake, alert  and oriented  Airway and Oxygen Therapy: Patient Spontanous Breathing  Post-op Pain: mild  Post-op Assessment: Post-op Vital signs reviewed, Patient's Cardiovascular Status Stable, Respiratory Function Stable, Patent Airway, Adequate PO intake and Pain level controlled  Post-op Vital Signs: stable  Complications: No apparent anesthesia complications

## 2012-08-13 NOTE — Op Note (Signed)
[facility] [address] [place], [address]   ERCP PROCEDURE REPORT  PATIENT: Jill Ray, Jill Ray  MR# :[id number] BIRTHDATE: December 10, 1980  GENDER: Female ENDOSCOPIST: Vida Rigger, MD REFERRED BY: Chevis Pretty, M.D. PROCEDURE DATE:  08/13/2012 PROCEDURE:   ERCP with sphincterotomy/papillotomy, ERCP with removal of calculus/calculi , and ERCP with balloon dilation ASA CLASS:    1 INDICATIONS: probable CBD stones MEDICATIONS:    generalanesthesiaTOPICAL ANESTHETIC:  no  DESCRIPTION OF PROCEDURE:   After the risks benefits and alternatives of the procedure were thoroughly explained, informed consent was obtained.  The Pentax ERCP [id number]  endoscope was introduced through the mouth and advanced to the second portion of the duodenum .a slightly bulbous ampulla was brought into view and using the triple-lumen sphincterotome loaded with the JAG guidewire deep selective cannulation was obtained there was no pancreatic duct wire advancements nor any pancreatic duct injections we did initially placed the wire seemingly towards the cystic duct and the sphincterotome and wire was repositioned we went ahead and proceeded with a moderate sphincterotomy until we had adequate biliary drainage but based on the angle we could only get the three quarters both sphincterotome in and out of the duct and then proceed with multiple balloon pull-through using the 12 mm adjustable balloon however with each pull through there was some resistance at the ampulla and we wound up popping the balloon and we went ahead and balloon dilated the sphincterotomy site and distal duct using the 4 cm x 8 mm balloon which was done in the customary fashion and we were able to pull the balloon through much easier and no additional debris or stones was removed and 2 attempts at occlusion cholangiograms were donebut do to increased air in the systemexcellent pictures were not obtained but the balloon  was pulled readily through the patent sphincterotomy site and there is adequate biliary drainage and the wire was removed and the procedure was terminated after the scope was removed and air was suctioned and patient tolerated the procedure well there was no obvious immediate complication       COMPLICATIONS: none ENDOSCOPIC IMPRESSION:1 slightly bulbous ampulla 2. No PD injection or wire advancement3 sphincterotomy and a few small stone removal 4. Balloon dilation of the distal duct 5. No obvious residual stone on occlusion cholangiogram and adequate biliary drainage at the end of the procedure  RECOMMENDATIONS:observe for delayed if none slowly advance diet and hopefully able to go home after evening meal today or tomorrow and happy see back when necessary     _______________________________ eSigned:  Vida Rigger, MD 08/13/2012 10:24 AM   XB:JYNW Carolynne Edouard, MD

## 2012-08-13 NOTE — Progress Notes (Signed)
Day of Surgery  Subjective: Just coming back from ERCP, up to chair and a little sleepy. Still tender.  Objective: Vital signs in last 24 hours: Temp:  [98.2 F (36.8 C)-98.6 F (37 C)] 98.2 F (36.8 C) (04/13 1106) Pulse Rate:  [68-106] 68 (04/13 1106) Resp:  [15-20] 20 (04/13 1106) BP: (104-121)/(68-79) 115/73 mmHg (04/13 1106) SpO2:  [89 %-100 %] 93 % (04/13 1106) Last BM Date: 08/07/12 Currently NPO just back from ERCP. Afebrile, VSS, LFT's improving  Intake/Output from previous day: 04/12 0701 - 04/13 0700 In: 2280 [P.O.:480; I.V.:1800] Out: 800 [Urine:800] Intake/Output this shift: Total I/O In: 1200 [I.V.:1200] Out: -   General appearance: alert, cooperative, no distress and still tired from ERCP/anesthesia. Resp: clear to auscultation bilaterally GI: soft, still tender, few BS, Incisions look fine.  Lab Results:   Recent Labs  08/12/12 0708 08/13/12 0805  WBC 7.7 6.8  HGB 12.1 12.1  HCT 34.4* 34.5*  PLT 188 200    BMET  Recent Labs  08/12/12 0708 08/13/12 0805  NA 137 138  K 3.5 3.8  CL 103 103  CO2 27 24  GLUCOSE 115* 106*  BUN 5* 5*  CREATININE 0.44* 0.45*  CALCIUM 8.8 9.3   PT/INR No results found for this basename: LABPROT, INR,  in the last 72 hours   Recent Labs Lab 08/09/12 0510 08/10/12 0600 08/11/12 0525 08/12/12 0708 08/13/12 0805  AST 70* 40* 39* 546* 81*  ALT 176* 106* 89* 321* 191*  ALKPHOS 169* 150* 165* 595* 483*  BILITOT 0.4 0.6 0.6 2.7* 0.6  PROT 6.4 6.5 6.6 6.3 6.8  ALBUMIN 3.1* 2.9* 2.8* 2.6* 2.7*     Lipase     Component Value Date/Time   LIPASE 246* 08/12/2012 0708     Studies/Results: Dg Ercp Biliary & Pancreatic Ducts  08/13/2012  *RADIOLOGY REPORT*  Clinical Data: Common bile duct stones.  ERCP  Comparison:  Ultrasound 08/08/2012  Technique:  Multiple spot images obtained with the fluoroscopic device and submitted for interpretation post-procedure.  ERCP was performed by Dr. Ewing Schlein.  Findings: The  common bile duct was cannulated and opacified.  Wire and contrast are identified in the intrahepatic ducts.  A balloon was dilated in the distal common bile duct. Question round filling defects in the distal common bile duct which could represent stones.  IMPRESSION: Opacification of the biliary system and question small stones.  These images were submitted for radiologic interpretation only. Please see the procedural report for the amount of contrast and the fluoroscopy time utilized.   Original Report Authenticated By: Richarda Overlie, M.D.     Medications: . acetaminophen      . [facility] HOLD] antiseptic oral rinse  15 mL Mouth Rinse q12n4p  . [facility] HOLD] chlorhexidine  15 mL Mouth Rinse BID  . cefOXitin (MEFOXIN) IVPB 1 gram/50 mL D5W (Pyxis)      . [MAR HOLD] heparin  5,000 Units Subcutaneous Q8H  . [MAR HOLD] pantoprazole (PROTONIX) IV  40 mg Intravenous QHS    Assessment/Plan Gallstone Pancreatitis s/p Laparoscopic cholecystectomy,  08/11/2012,[name], MD  ERCP with sphincterotomy/papillotomy, ERCP with removal  of calculus/calculi , and ERCP with balloon dilation, 08/13/2012,Marc E Magod, MD.  Post partum 28 days ( still using breast pump)  Hx of asthma,  Anemia   Plan:  Start her on clear liquids, and advance thru the day.  Mobilize later this afternoon, and recheck labs in AM.  If ok send her home tomorrow.   LOS: 5  days    Jill Ray 08/13/2012

## 2012-08-14 ENCOUNTER — Encounter (HOSPITAL_COMMUNITY): Payer: Self-pay | Admitting: Gastroenterology

## 2012-08-14 DIAGNOSIS — K851 Biliary acute pancreatitis without necrosis or infection: Secondary | ICD-10-CM | POA: Diagnosis present

## 2012-08-14 LAB — CBC WITH DIFFERENTIAL/PLATELET
Eosinophils Absolute: 0.2 10*3/uL (ref 0.0–0.7)
Eosinophils Relative: 2 % (ref 0–5)
Lymphs Abs: 1.5 10*3/uL (ref 0.7–4.0)
MCH: 31.4 pg (ref 26.0–34.0)
MCV: 89 fL (ref 78.0–100.0)
Monocytes Relative: 10 % (ref 3–12)
Platelets: 216 10*3/uL (ref 150–400)
RBC: 3.73 MIL/uL — ABNORMAL LOW (ref 3.87–5.11)

## 2012-08-14 LAB — COMPREHENSIVE METABOLIC PANEL
BUN: 5 mg/dL — ABNORMAL LOW (ref 6–23)
Calcium: 9.1 mg/dL (ref 8.4–10.5)
Creatinine, Ser: 0.46 mg/dL — ABNORMAL LOW (ref 0.50–1.10)
GFR calc Af Amer: >90 mL/min (ref >90)
Glucose, Bld: 113 mg/dL — ABNORMAL HIGH (ref 70–99)
Sodium: 139 mEq/L (ref 135–145)
Total Protein: 6.4 g/dL (ref 6.0–8.3)

## 2012-08-14 MED ORDER — ACETAMINOPHEN 325 MG PO TABS: 650.0000 mg | ORAL_TABLET | Freq: Four times a day (QID) | ORAL | Status: DC | PRN

## 2012-08-14 MED ORDER — OXYCODONE-ACETAMINOPHEN 5-325 MG PO TABS: 1.0000 | ORAL_TABLET | ORAL | Status: DC | PRN

## 2012-08-14 NOTE — Discharge Summary (Signed)
Physician Discharge Summary  Patient ID: Jill Ray MRN: [id number] DOB/AGE: 32-Apr-1982 31 y.o.  Admit date: 08/08/2012 Discharge date: 08/14/2012  Admission Diagnoses: Gallstone pancreatitis  Discharge Diagnoses: Same Active Problems:   Gallstone pancreatitis   PROCEDURES:  1.  s/p Laparoscopic cholecystectomy, 08/11/2012,[name], MD  2.  ERCP with sphincterotomy/papillotomy, ERCP with removal  of calculus/calculi , and ERCP with balloon dilation, 08/13/2012,Marc Shanon Ace, MD.    Hospital Course: 66 yof who delivered her son 22 days ago. She has now three episodes of ruq/epigastric pain radiating to the back. This is associated with n/v. This episode began this am and is the worst. She denies fevers. Nothing was relieving this at home. Unable to eat. She presented to er. She was admitted and place on bowel rest, and hydration.  She improved over the next few days and was taken to the OR on 08/11/12.  It was Dr. Kerry Dory opinion she had retained stones.  She was seen by Dr Claretha Cooper and went for ERCP on 08/13/12. Today she is doing well, and is ready for D/c. I recommended she not resume breast feeding until she has stopped taking narcotics for pain.  Follow up in 2 weeks.  Condition on d/c:  Improved   Disposition: 01-Home or Self Care     Medication List    TAKE these medications       acetaminophen 325 MG tablet  Commonly known as:  TYLENOL  Take 2 tablets (650 mg total) by mouth every 6 (six) hours as needed.     calcium carbonate 1250 MG chewable tablet  Commonly known as:  OS-CAL  Chew 1 tablet by mouth once.     multivitamin-prenatal 27-0.8 MG Tabs  Take 1 tablet by mouth daily.     omeprazole 20 MG capsule  Commonly known as:  PRILOSEC  Take 40 mg by mouth daily.     oxyCODONE-acetaminophen 5-325 MG per tablet  Commonly known as:  PERCOCET/ROXICET  Take 1-2 tablets by mouth every 4 (four) hours as needed.       Follow-up Information   Follow up with [facility], [place]. Schedule an appointment as soon as possible for a visit in 3 weeks. (Ask for the DOW clinic or Dr. Dwain Sarna if clinic is full.)    Contact information:   [address] [place] [address] [phone]       Follow up with CALL your OB/GYN doctor for follow up and questions on breast feeding..      Please follow up. (No lifting over 20 pounds for 3 weeks.)       Signed: Sherrie George 08/14/2012, 10:33 AM

## 2012-08-14 NOTE — Progress Notes (Signed)
1 Day Post-Op  Subjective:  Feeling much better, eating and walking, +BM.  Ready to go home.  Objective: Vital signs in last 24 hours: Temp:  [98.1 F (36.7 C)-100.1 F (37.8 C)] 98.1 F (36.7 C) (04/14 0521) Pulse Rate:  [68-107] 70 (04/14 0521) Resp:  [15-20] 16 (04/14 0521) BP: (98-124)/(52-80) 115/80 mmHg (04/14 0521) SpO2:  [89 %-100 %] 100 % (04/14 0521) Last BM Date: 08/07/12   120 PO recorded, Diet: regular, BP down some last PM but up this Am Labs are all trending back down. Intake/Output from previous day: 04/13 0701 - 04/14 0700 In: 2470 [P.O.:120; I.V.:2250; IV Piggyback:100] Out: 1400 [Urine:1400] Intake/Output this shift:    General appearance: alert, cooperative and no distress GI: soft, post op tender; bowel sounds normal; no masses,  no organomegaly Incisions look fine. Lab Results:   Recent Labs  08/13/12 0805 08/14/12 0646  WBC 6.8 7.1  HGB 12.1 11.7*  HCT 34.5* 33.2*  PLT 200 216    BMET  Recent Labs  08/13/12 0805 08/14/12 0646  NA 138 139  K 3.8 3.7  CL 103 105  CO2 24 27  GLUCOSE 106* 113*  BUN 5* 5*  CREATININE 0.45* 0.46*  CALCIUM 9.3 9.1   PT/INR No results found for this basename: LABPROT, INR,  in the last 72 hours   Recent Labs Lab 08/10/12 0600 08/11/12 0525 08/12/12 0708 08/13/12 0805 08/14/12 0646  AST 40* 39* 546* 81* 31  ALT 106* 89* 321* 191* 119*  ALKPHOS 150* 165* 595* 483* 387*  BILITOT 0.6 0.6 2.7* 0.6 0.4  PROT 6.5 6.6 6.3 6.8 6.4  ALBUMIN 2.9* 2.8* 2.6* 2.7* 2.6*     Lipase     Component Value Date/Time   LIPASE 246* 08/12/2012 0708     Studies/Results: Dg Ercp Biliary & Pancreatic Ducts  08/13/2012  *RADIOLOGY REPORT*  Clinical Data: Common bile duct stones.  ERCP  Comparison:  Ultrasound 08/08/2012  Technique:  Multiple spot images obtained with the fluoroscopic device and submitted for interpretation post-procedure.  ERCP was performed by Dr. Ewing Schlein.  Findings: The common bile duct was  cannulated and opacified.  Wire and contrast are identified in the intrahepatic ducts.  A balloon was dilated in the distal common bile duct. Question round filling defects in the distal common bile duct which could represent stones.  IMPRESSION: Opacification of the biliary system and question small stones.  These images were submitted for radiologic interpretation only. Please see the procedural report for the amount of contrast and the fluoroscopy time utilized.   Original Report Authenticated By: Richarda Overlie, M.D.     Medications: . antiseptic oral rinse  15 mL Mouth Rinse q12n4p  . chlorhexidine  15 mL Mouth Rinse BID  . heparin  5,000 Units Subcutaneous Q8H  . pantoprazole (PROTONIX) IV  40 mg Intravenous QHS    Assessment/Plan Gallstone Pancreatitis s/p Laparoscopic cholecystectomy, 08/11/2012,[name], MD  ERCP with sphincterotomy/papillotomy, ERCP with removal  of calculus/calculi , and ERCP with balloon dilation, 08/13/2012,Marc E Magod, MD.  Post partum 28 days ( still using breast pump)  Hx of asthma,  Anemia    Plan: Home today. Follow up in 2 weeks        LOS: 6 days    Jill Ray 08/14/2012

## 2012-08-15 ENCOUNTER — Encounter (HOSPITAL_COMMUNITY): Payer: Self-pay | Admitting: General Surgery

## 2012-08-16 NOTE — Discharge Summary (Signed)
Jill Ray M. Jill Camerer, MD, FACS General, Bariatric, & Minimally Invasive Surgery [facility]  

## 2012-08-16 NOTE — Progress Notes (Signed)
Discussed with PA  Melayah Skorupski M. Zanyiah Posten, MD, FACS General, Bariatric, & Minimally Invasive Surgery [facility]  

## 2012-08-25 ENCOUNTER — Telehealth (INDEPENDENT_AMBULATORY_CARE_PROVIDER_SITE_OTHER): Payer: Self-pay | Admitting: General Surgery

## 2012-08-25 NOTE — Telephone Encounter (Signed)
Husband called for wife, asking for appt with Dr. Dwain Sarna (who did lap chole on 08/11/12 while on DOW duties.)  Pt has DOW clinic follow up appt next week.  Husband states she has cough; acknowledges it is a wet cough.  Suggested they try some OTC Delsym.  Also stated her cough is not related to her surgery, so she should call her PCP for any further questions about her URI symptoms.

## 2012-08-29 ENCOUNTER — Ambulatory Visit (INDEPENDENT_AMBULATORY_CARE_PROVIDER_SITE_OTHER): Payer: Medicaid Other | Admitting: Internal Medicine

## 2012-08-29 ENCOUNTER — Encounter (INDEPENDENT_AMBULATORY_CARE_PROVIDER_SITE_OTHER): Payer: Self-pay | Admitting: General Surgery

## 2012-08-29 ENCOUNTER — Encounter (INDEPENDENT_AMBULATORY_CARE_PROVIDER_SITE_OTHER): Payer: Self-pay

## 2012-08-29 VITALS — BP 114/76 | HR 102 | Temp 97.7°F | Ht 63.0 in | Wt 155.6 lb

## 2012-08-29 DIAGNOSIS — R062 Wheezing: Secondary | ICD-10-CM

## 2012-08-29 DIAGNOSIS — K851 Biliary acute pancreatitis without necrosis or infection: Secondary | ICD-10-CM

## 2012-08-29 DIAGNOSIS — R0989 Other specified symptoms and signs involving the circulatory and respiratory systems: Secondary | ICD-10-CM

## 2012-08-29 DIAGNOSIS — K859 Acute pancreatitis without necrosis or infection, unspecified: Secondary | ICD-10-CM

## 2012-08-29 MED ORDER — ALBUTEROL SULFATE HFA 108 (90 BASE) MCG/ACT IN AERS: 2.0000 | INHALATION_SPRAY | RESPIRATORY_TRACT | Status: DC | PRN

## 2012-08-29 MED ORDER — SULFAMETHOXAZOLE-TRIMETHOPRIM 800-160 MG PO TABS: 1.0000 | ORAL_TABLET | Freq: Two times a day (BID) | ORAL | Status: AC

## 2012-08-29 NOTE — Progress Notes (Signed)
  Subjective: Pt returns to the clinic today after undergoing laparoscopic cholecystectomy on 08/11/12 by Dr. Dwain Sarna for gallstone pancreatitis.  The patient is still having a poor appetite but is having no severe pain.  Bowel function is good.  No problems with the wounds.  She is having lots of wheezing, productive coughing, shortness of breath and weakness.  She denies chest pain or fever.  Objective: Vital signs in last 24 hours: Reviewed  PE: Abd: soft, non-tender, +bs, incisions well healed Lungs: wheezing and rhonchi bilateral with decreased breath sounds, productive cough in room that is green  Lab Results:  No results found for this basename: WBC, HGB, HCT, PLT,  in the last 72 hours BMET No results found for this basename: NA, K, CL, CO2, GLUCOSE, BUN, CREATININE, CALCIUM,  in the last 72 hours PT/INR No results found for this basename: LABPROT, INR,  in the last 72 hours CMP     Component Value Date/Time   NA 139 08/14/2012 0646   K 3.7 08/14/2012 0646   CL 105 08/14/2012 0646   CO2 27 08/14/2012 0646   GLUCOSE 113* 08/14/2012 0646   BUN 5* 08/14/2012 0646   CREATININE 0.46* 08/14/2012 0646   CALCIUM 9.1 08/14/2012 0646   PROT 6.4 08/14/2012 0646   ALBUMIN 2.6* 08/14/2012 0646   AST 31 08/14/2012 0646   ALT 119* 08/14/2012 0646   ALKPHOS 387* 08/14/2012 0646   BILITOT 0.4 08/14/2012 0646   GFRNONAA >90 08/14/2012 0646   GFRAA >90 08/14/2012 0646   Lipase     Component Value Date/Time   LIPASE 246* 08/12/2012 0708       Studies/Results: No results found.  Anti-infectives: Anti-infectives   Start     Dose/Rate Route Frequency Ordered Stop   08/29/12 0000  sulfamethoxazole-trimethoprim (BACTRIM DS) 800-160 MG per tablet     1 tablet Oral 2 times daily 08/29/12 1304 09/01/12 2359       Assessment/Plan  1.  S/P Laparoscopic Cholecystectomy: may resume regular activity without restrictions, however having resp issues that seem to be consistent with pneumonia, I have  recommended a chest x-ray but the patient does not want to do this because she does not have insurance and has no way to pay.  I also recommended a primary care md but she does not have one of these and does not want to get one.  I will treat her with bactrim and albuterol and have her follow up next week.  If it is no better then she will need to find a primary care MD.     Eliese Kerwood, [facility] 08/29/2012

## 2012-08-29 NOTE — Patient Instructions (Addendum)
May resume regular activity without restrictions. Follow up as needed regarding your gallbladder surgery Call with questions or concerns.     Follow up in one week to recheck wheezing and rhonchi If this is worse then patient will have to find a primary care doctor.

## 2012-09-05 ENCOUNTER — Encounter (INDEPENDENT_AMBULATORY_CARE_PROVIDER_SITE_OTHER): Payer: Self-pay | Admitting: Internal Medicine

## 2012-09-05 ENCOUNTER — Ambulatory Visit (INDEPENDENT_AMBULATORY_CARE_PROVIDER_SITE_OTHER): Payer: Medicaid Other | Admitting: Internal Medicine

## 2012-09-05 VITALS — BP 130/74 | HR 100 | Temp 98.0°F | Resp 18 | Ht 63.0 in | Wt 160.0 lb

## 2012-09-05 DIAGNOSIS — K851 Biliary acute pancreatitis without necrosis or infection: Secondary | ICD-10-CM

## 2012-09-05 DIAGNOSIS — K859 Acute pancreatitis without necrosis or infection, unspecified: Secondary | ICD-10-CM

## 2012-09-05 NOTE — Patient Instructions (Signed)
May resume regular activity without restrictions. Follow up as needed. Call with questions or concerns.  

## 2012-09-05 NOTE — Progress Notes (Signed)
  Subjective: Pt returns to the clinic today for recheck.  She is doing better although not symptom free yet.  Her appetite is better and breathing is better.  She is not as nauseated.  Objective: Vital signs in last 24 hours: Reviewed  PE: Lungs: rare wheeze, much clearer then last week, clear in bases, good effort Abd: softer, less tender, good bowel sounds.   Lab Results:  No results found for this basename: WBC, HGB, HCT, PLT,  in the last 72 hours BMET No results found for this basename: NA, K, CL, CO2, GLUCOSE, BUN, CREATININE, CALCIUM,  in the last 72 hours PT/INR No results found for this basename: LABPROT, INR,  in the last 72 hours CMP     Component Value Date/Time   NA 139 08/14/2012 0646   K 3.7 08/14/2012 0646   CL 105 08/14/2012 0646   CO2 27 08/14/2012 0646   GLUCOSE 113* 08/14/2012 0646   BUN 5* 08/14/2012 0646   CREATININE 0.46* 08/14/2012 0646   CALCIUM 9.1 08/14/2012 0646   PROT 6.4 08/14/2012 0646   ALBUMIN 2.6* 08/14/2012 0646   AST 31 08/14/2012 0646   ALT 119* 08/14/2012 0646   ALKPHOS 387* 08/14/2012 0646   BILITOT 0.4 08/14/2012 0646   GFRNONAA >90 08/14/2012 0646   GFRAA >90 08/14/2012 0646   Lipase     Component Value Date/Time   LIPASE 246* 08/12/2012 0708       Studies/Results: No results found.  Anti-infectives: Anti-infectives   None       Assessment/Plan  1.  S/P lap chol: she is doing better this week and I think a lot of her abdominal symptoms last week were due to pneumonia or an upper respiratory infection.  I have strongly encouraged her to get a family doctor since she does have long standing asthma as this needs to be managed by someone on a regular basis.  We will see her back on an as needed basis from her surgery standpoint.  She may call with questions or concerns.     Tyan Dy 09/05/2012

## 2012-09-11 ENCOUNTER — Encounter (INDEPENDENT_AMBULATORY_CARE_PROVIDER_SITE_OTHER): Payer: Self-pay

## 2014-03-04 ENCOUNTER — Encounter (INDEPENDENT_AMBULATORY_CARE_PROVIDER_SITE_OTHER): Payer: Self-pay | Admitting: Internal Medicine

## 2015-04-08 ENCOUNTER — Encounter: Payer: Self-pay | Admitting: *Deleted

## 2015-05-01 ENCOUNTER — Ambulatory Visit: Payer: PRIVATE HEALTH INSURANCE | Admitting: Obstetrics & Gynecology

## 2015-05-26 ENCOUNTER — Ambulatory Visit (INDEPENDENT_AMBULATORY_CARE_PROVIDER_SITE_OTHER): Payer: Medicaid Other | Admitting: Obstetrics and Gynecology

## 2015-05-26 ENCOUNTER — Encounter: Payer: Self-pay | Admitting: Obstetrics and Gynecology

## 2015-05-26 VITALS — BP 79/51 | HR 73 | Temp 98.2°F | Ht 62.0 in | Wt 152.0 lb

## 2015-05-26 DIAGNOSIS — Z30432 Encounter for removal of intrauterine contraceptive device: Secondary | ICD-10-CM

## 2015-05-26 MED ORDER — NORGESTIMATE-ETH ESTRADIOL 0.25-35 MG-MCG PO TABS: 1.0000 | ORAL_TABLET | Freq: Every day | ORAL | Status: DC

## 2015-05-26 NOTE — Progress Notes (Signed)
Patient ID: Jill Ray, female   DOB: Apr 25, 1981, 35 y.o.   MRN: [id number] 35 yo G2P2 presenting today for IUD removal. Patient is planning on conceiving in the next few months and would like to start OCP. She has used them in the past without issues. No current contraindications  PROCEDURE DATE:   The patient was placed in the dorsal lithotomy position. The cervix was visualized along with the IUD strings extending from the os. The strings were grasped with a ring Forceps and the IUD was removed without difficulty. The patient tolerated the procedure well.  A/P 35 yo G2P2 here for IUD removal - IUD removed without difficulty - Rx Sprintec provided - Patient advised to start prenatal vitamins - RTC prn

## 2015-12-15 ENCOUNTER — Inpatient Hospital Stay (HOSPITAL_COMMUNITY): Payer: Medicaid Other

## 2015-12-15 ENCOUNTER — Encounter (HOSPITAL_COMMUNITY): Payer: Self-pay

## 2015-12-15 ENCOUNTER — Inpatient Hospital Stay (HOSPITAL_COMMUNITY)
Admission: AD | Admit: 2015-12-15 | Discharge: 2015-12-15 | Disposition: A | Discharge status: Home / Self Care | Payer: Medicaid Other | Source: Ambulatory Visit | Attending: Obstetrics and Gynecology | Admitting: Obstetrics and Gynecology

## 2015-12-15 DIAGNOSIS — R102 Pelvic and perineal pain: Secondary | ICD-10-CM | POA: Diagnosis not present

## 2015-12-15 DIAGNOSIS — O09521 Supervision of elderly multigravida, first trimester: Secondary | ICD-10-CM | POA: Diagnosis not present

## 2015-12-15 DIAGNOSIS — O9989 Other specified diseases and conditions complicating pregnancy, childbirth and the puerperium: Secondary | ICD-10-CM

## 2015-12-15 DIAGNOSIS — O26899 Other specified pregnancy related conditions, unspecified trimester: Secondary | ICD-10-CM

## 2015-12-15 DIAGNOSIS — R109 Unspecified abdominal pain: Secondary | ICD-10-CM | POA: Insufficient documentation

## 2015-12-15 DIAGNOSIS — O26891 Other specified pregnancy related conditions, first trimester: Secondary | ICD-10-CM | POA: Diagnosis not present

## 2015-12-15 DIAGNOSIS — R5383 Other fatigue: Secondary | ICD-10-CM | POA: Diagnosis present

## 2015-12-15 DIAGNOSIS — Z881 Allergy status to other antibiotic agents status: Secondary | ICD-10-CM | POA: Insufficient documentation

## 2015-12-15 DIAGNOSIS — O26811 Pregnancy related exhaustion and fatigue, first trimester: Secondary | ICD-10-CM | POA: Diagnosis not present

## 2015-12-15 DIAGNOSIS — Z3A09 9 weeks gestation of pregnancy: Secondary | ICD-10-CM | POA: Insufficient documentation

## 2015-12-15 DIAGNOSIS — Z3491 Encounter for supervision of normal pregnancy, unspecified, first trimester: Secondary | ICD-10-CM

## 2015-12-15 LAB — URINALYSIS, ROUTINE W REFLEX MICROSCOPIC
BILIRUBIN URINE: NEGATIVE
GLUCOSE, UA: 100 mg/dL — AB
Hgb urine dipstick: NEGATIVE
KETONES UR: NEGATIVE mg/dL
NITRITE: NEGATIVE
PH: 5.5 (ref 5.0–8.0)
PROTEIN: NEGATIVE mg/dL
Specific Gravity, Urine: >1.03 — ABNORMAL HIGH (ref 1.005–1.030)

## 2015-12-15 LAB — CBC
HEMATOCRIT: 34.6 % — AB (ref 36.0–46.0)
Hemoglobin: 12.1 g/dL (ref 12.0–15.0)
MCH: 30.6 pg (ref 26.0–34.0)
MCHC: 35 g/dL (ref 30.0–36.0)
MCV: 87.4 fL (ref 78.0–100.0)
PLATELETS: 219 10*3/uL (ref 150–400)
RBC: 3.96 MIL/uL (ref 3.87–5.11)
RDW: 13.1 % (ref 11.5–15.5)
WBC: 7.2 10*3/uL (ref 4.0–10.5)

## 2015-12-15 LAB — WET PREP, GENITAL
CLUE CELLS WET PREP: NONE SEEN
TRICH WET PREP: NONE SEEN
Yeast Wet Prep HPF POC: NONE SEEN

## 2015-12-15 LAB — URINE MICROSCOPIC-ADD ON

## 2015-12-15 LAB — HCG, QUANTITATIVE, PREGNANCY: hCG, Beta Chain, Quant, S: 106052 m[IU]/mL — ABNORMAL HIGH (ref <5)

## 2015-12-15 LAB — POCT PREGNANCY, URINE: Preg Test, Ur: POSITIVE — AB

## 2015-12-15 MED ORDER — PRENATAL VITAMINS 28-0.8 MG PO TABS: 1.0000 | ORAL_TABLET | Freq: Every day | ORAL | 5 refills | Status: DC

## 2015-12-15 NOTE — MAU Note (Signed)
Pt has been feeling very weak for three weeks.  She states she has a cold sore and a fever at night time.  Denies VB.

## 2015-12-15 NOTE — Discharge Instructions (Signed)
First Trimester of Pregnancy  The first trimester of pregnancy is from week 1 until the end of week 12 (months 1 through 3). A week after a sperm fertilizes an egg, the egg will implant on the wall of the uterus. This embryo will begin to develop into a baby. Genes from you and your partner are forming the baby. The female genes determine whether the baby is a boy or a girl. At 6-8 weeks, the eyes and face are formed, and the heartbeat can be seen on ultrasound. At the end of 12 weeks, all the baby's organs are formed.   Now that you are pregnant, you will want to do everything you can to have a healthy baby. Two of the most important things are to get good prenatal care and to follow your health care provider's instructions. Prenatal care is all the medical care you receive before the baby's birth. This care will help prevent, find, and treat any problems during the pregnancy and childbirth.  BODY CHANGES  Your body goes through many changes during pregnancy. The changes vary from woman to woman.   · You may gain or lose a couple of pounds at first.  · You may feel sick to your stomach (nauseous) and throw up (vomit). If the vomiting is uncontrollable, call your health care provider.  · You may tire easily.  · You may develop headaches that can be relieved by medicines approved by your health care provider.  · You may urinate more often. Painful urination may mean you have a bladder infection.  · You may develop heartburn as a result of your pregnancy.  · You may develop constipation because certain hormones are causing the muscles that push waste through your intestines to slow down.  · You may develop hemorrhoids or swollen, bulging veins (varicose veins).  · Your breasts may begin to grow larger and become tender. Your nipples may stick out more, and the tissue that surrounds them (areola) may become darker.  · Your gums may bleed and may be sensitive to brushing and flossing.   · Dark spots or blotches (chloasma, mask of pregnancy) may develop on your face. This will likely fade after the baby is born.  · Your menstrual periods will stop.  · You may have a loss of appetite.  · You may develop cravings for certain kinds of food.  · You may have changes in your emotions from day to day, such as being excited to be pregnant or being concerned that something may go wrong with the pregnancy and baby.  · You may have more vivid and strange dreams.  · You may have changes in your hair. These can include thickening of your hair, rapid growth, and changes in texture. Some women also have hair loss during or after pregnancy, or hair that feels dry or thin. Your hair will most likely return to normal after your baby is born.  WHAT TO EXPECT AT YOUR PRENATAL VISITS  During a routine prenatal visit:  · You will be weighed to make sure you and the baby are growing normally.  · Your blood pressure will be taken.  · Your abdomen will be measured to track your baby's growth.  · The fetal heartbeat will be listened to starting around week 10 or 12 of your pregnancy.  · Test results from any previous visits will be discussed.  Your health care provider may ask you:  · How you are feeling.  · If you   including cigarettes, chewing tobacco, and electronic cigarettes.  If you have any questions. Other tests that may be performed during your first trimester include:  Blood tests to find your blood type and to check for the presence of any previous infections. They will also be used to check for low iron levels (anemia) and Rh antibodies. Later in the pregnancy, blood tests for diabetes will be done along with other tests if problems develop.  Urine tests to check for infections, diabetes, or protein in the urine.  An ultrasound to confirm the proper growth  and development of the baby.  An amniocentesis to check for possible genetic problems.  Fetal screens for spina bifida and Down syndrome.  You may need other tests to make sure you and the baby are doing well.  HIV (human immunodeficiency virus) testing. Routine prenatal testing includes screening for HIV, unless you choose not to have this test. HOME CARE INSTRUCTIONS  Medicines  Follow your health care provider's instructions regarding medicine use. Specific medicines may be either safe or unsafe to take during pregnancy.  Take your prenatal vitamins as directed.  If you develop constipation, try taking a stool softener if your health care provider approves. Diet  Eat regular, well-balanced meals. Choose a variety of foods, such as meat or vegetable-based protein, fish, milk and low-fat dairy products, vegetables, fruits, and whole grain breads and cereals. Your health care provider will help you determine the amount of weight gain that is right for you.  Avoid raw meat and uncooked cheese. These carry germs that can cause birth defects in the baby.  Eating four or five small meals rather than three large meals a day may help relieve nausea and vomiting. If you start to feel nauseous, eating a few soda crackers can be helpful. Drinking liquids between meals instead of during meals also seems to help nausea and vomiting.  If you develop constipation, eat more high-fiber foods, such as fresh vegetables or fruit and whole grains. Drink enough fluids to keep your urine clear or pale yellow. Activity and Exercise  Exercise only as directed by your health care provider. Exercising will help you:  Control your weight.  Stay in shape.  Be prepared for labor and delivery.  Experiencing pain or cramping in the lower abdomen or low back is a good sign that you should stop exercising. Check with your health care provider before continuing normal exercises.  Try to avoid standing for long  periods of time. Move your legs often if you must stand in one place for a long time.  Avoid heavy lifting.  Wear low-heeled shoes, and practice good posture.  You may continue to have sex unless your health care provider directs you otherwise. Relief of Pain or Discomfort  Wear a good support bra for breast tenderness.   Take warm sitz baths to soothe any pain or discomfort caused by hemorrhoids. Use hemorrhoid cream if your health care provider approves.   Rest with your legs elevated if you have leg cramps or low back pain.  If you develop varicose veins in your legs, wear support hose. Elevate your feet for 15 minutes, 3-4 times a day. Limit salt in your diet. Prenatal Care  Schedule your prenatal visits by the twelfth week of pregnancy. They are usually scheduled monthly at first, then more often in the last 2 months before delivery.  Write down your questions. Take them to your prenatal visits.  Keep all your prenatal visits as directed by your  health care provider. Safety  Wear your seat belt at all times when driving.  Make a list of emergency phone numbers, including numbers for family, friends, the hospital, and police and fire departments. General Tips  Ask your health care provider for a referral to a local prenatal education class. Begin classes no later than at the beginning of month 6 of your pregnancy.  Ask for help if you have counseling or nutritional needs during pregnancy. Your health care provider can offer advice or refer you to specialists for help with various needs.  Do not use hot tubs, steam rooms, or saunas.  Do not douche or use tampons or scented sanitary pads.  Do not cross your legs for long periods of time.  Avoid cat litter boxes and soil used by cats. These carry germs that can cause birth defects in the baby and possibly loss of the fetus by miscarriage or stillbirth.  Avoid all smoking, herbs, alcohol, and medicines not prescribed by  your health care provider. Chemicals in these affect the formation and growth of the baby.  Do not use any tobacco products, including cigarettes, chewing tobacco, and electronic cigarettes. If you need help quitting, ask your health care provider. You may receive counseling support and other resources to help you quit.  Schedule a dentist appointment. At home, brush your teeth with a soft toothbrush and be gentle when you floss. SEEK MEDICAL CARE IF:   You have dizziness.  You have mild pelvic cramps, pelvic pressure, or nagging pain in the abdominal area.  You have persistent nausea, vomiting, or diarrhea.  You have a bad smelling vaginal discharge.  You have pain with urination.  You notice increased swelling in your face, hands, legs, or ankles. SEEK IMMEDIATE MEDICAL CARE IF:   You have a fever.  You are leaking fluid from your vagina.  You have spotting or bleeding from your vagina.  You have severe abdominal cramping or pain.  You have rapid weight gain or loss.  You vomit blood or material that looks like coffee grounds.  You are exposed to [place] measles and have never had them.  You are exposed to fifth disease or chickenpox.  You develop a severe headache.  You have shortness of breath.  You have any kind of trauma, such as from a fall or a car accident.   This information is not intended to replace advice given to you by your health care provider. Make sure you discuss any questions you have with your health care provider.   Document Released: 04/13/2001 Document Revised: 05/10/2014 Document Reviewed: 02/27/2013 Elsevier Interactive Patient Education 2016 [employer].  Fatigue Fatigue is feeling tired all of the time, a lack of energy, or a lack of motivation. Occasional or mild fatigue is often a normal response to activity or life in general. However, long-lasting (chronic) or extreme fatigue may indicate an underlying medical condition. HOME CARE  INSTRUCTIONS  Watch your fatigue for any changes. The following actions may help to lessen any discomfort you are feeling:  Talk to your health care provider about how much sleep you need each night. Try to get the required amount every night.  Take medicines only as directed by your health care provider.  Eat a healthy and nutritious diet. Ask your health care provider if you need help changing your diet.  Drink enough fluid to keep your urine clear or pale yellow.  Practice ways of relaxing, such as yoga, meditation, massage therapy, or acupuncture.  Exercise regularly.   Change situations that cause you stress. Try to keep your work and personal routine reasonable.  Do not abuse illegal drugs.  Limit alcohol intake to no more than 1 drink per day for nonpregnant women and 2 drinks per day for men. One drink equals 12 ounces of beer, 5 ounces of wine, or 1 ounces of hard liquor.  Take a multivitamin, if directed by your health care provider. SEEK MEDICAL CARE IF:   Your fatigue does not get better.  You have a fever.   You have unintentional weight loss or gain.  You have headaches.   You have difficulty:   Falling asleep.  Sleeping throughout the night.  You feel angry, guilty, anxious, or sad.   You are unable to have a bowel movement (constipation).   You skin is dry.   Your legs or another part of your body is swollen.  SEEK IMMEDIATE MEDICAL CARE IF:   You feel confused.   Your vision is blurry.  You feel faint or pass out.   You have a severe headache.   You have severe abdominal, pelvic, or back pain.   You have chest pain, shortness of breath, or an irregular or fast heartbeat.   You are unable to urinate or you urinate less than normal.   You develop abnormal bleeding, such as bleeding from the rectum, vagina, nose, lungs, or nipples.  You vomit blood.   You have thoughts about harming yourself or committing suicide.    You are worried that you might harm someone else.    This information is not intended to replace advice given to you by your health care provider. Make sure you discuss any questions you have with your health care provider.   Document Released: 02/14/2007 Document Revised: 05/10/2014 Document Reviewed: 08/21/2013 Elsevier Interactive Patient Education [employer].

## 2015-12-15 NOTE — MAU Provider Note (Signed)
Chief Complaint: Fatigue   First Provider Initiated Contact with Patient 12/15/15 1154      SUBJECTIVE HPI: Jill Ray is a 35 y.o. G3P2002 at [redacted]w[redacted]d by LMP who presents to maternity admissions reporting daily fatigue with positive pregnancy test.  Patient's last menstrual period was 10/11/2015.  She is sure of this date.  She reports she was not this tired in previous pregnancies and is concerned something is wrong.  She is not taking a prenatal vitamin this pregnancy. She also reports some occasional mild abdominal cramping that has not required any treatment. This pain started 1-2 weeks ago and is unchanged since onset. She denies vaginal bleeding, vaginal itching/burning, urinary symptoms, h/a, dizziness, n/v, or fever/chills.     HPI  Past Medical History:  Diagnosis Date  . Anemia   . Asthma   . Gallstone pancreatitis 08/08/2012   Archie Endo 08/08/2012  . Gallstone pancreatitis 08/14/2012  . Umbilical hernia    hernia (reducible umbilical hernia) is present. Archie Endo 08/08/2012  . Urinary tract infection    GBS in urine current preg   Past Surgical History:  Procedure Laterality Date  . CHOLECYSTECTOMY N/A 08/11/2012   Procedure: LAPAROSCOPIC CHOLECYSTECTOMY;  Surgeon: Rolm Bookbinder, MD;  Location: [place];  Service: General;  Laterality: N/A;  . ERCP N/A 08/13/2012   Procedure: ENDOSCOPIC RETROGRADE CHOLANGIOPANCREATOGRAPHY (ERCP);  Surgeon: Jeryl Columbia, MD;  Location: [place];  Service: Endoscopy;  Laterality: N/A;  . NO PAST SURGERIES    . SPHINCTEROTOMY  08/13/2012   Procedure: SPHINCTEROTOMY;  Surgeon: Jeryl Columbia, MD;  Location[facility];  Service: Endoscopy;;   Social History   Social History  . Marital status: Married    Spouse name: N/A  . Number of children: N/A  . Years of education: N/A   Occupational History  . Not on file.   Social History Main Topics  . Smoking status: Never Smoker  . Smokeless tobacco: Never Used  . Alcohol use No  . Drug use: No  . Sexual  activity: Not Currently   Other Topics Concern  . Not on file   Social History Narrative  . No narrative on file   No current facility-administered medications on file prior to encounter.    No current outpatient prescriptions on file prior to encounter.   Allergies  Allergen Reactions  . Levaquin [Levofloxacin In D5w] Itching    ROS:  Review of Systems  Constitutional: Positive for fatigue. Negative for chills and fever.  Respiratory: Negative for shortness of breath.   Cardiovascular: Negative for chest pain.  Gastrointestinal: Positive for abdominal pain.  Genitourinary: Positive for pelvic pain. Negative for difficulty urinating, dysuria, flank pain, vaginal bleeding, vaginal discharge and vaginal pain.  Neurological: Negative for dizziness and headaches.  Psychiatric/Behavioral: Negative.      I have reviewed patient's Past Medical Hx, Surgical Hx, Family Hx, Social Hx, medications and allergies.   Physical Exam   Patient Vitals for the past 24 hrs:  BP Temp Temp src Pulse Resp SpO2  12/15/15 1430 104/69 98.2 F (36.8 C) Oral 84 18 -  12/15/15 1110 119/71 98.3 F (36.8 C) Oral 90 16 100 %   Constitutional: Well-developed, well-nourished female in no acute distress.  Cardiovascular: normal rate Respiratory: normal effort GI: Abd soft, non-tender. Pos BS x 4 MS: Extremities nontender, no edema, normal ROM Neurologic: Alert and oriented x 4.  GU: Neg CVAT.  PELVIC EXAM: Cervix pink, visually closed, without lesion, scant white creamy discharge, vaginal walls and  external genitalia normal Bimanual exam: Cervix 0/long/high, firm, anterior, neg CMT, uterus nontender, ~9 week size, adnexa without tenderness, enlargement, or mass   LAB RESULTS Results for orders placed or performed during the hospital encounter of 12/15/15 (from the past 24 hour(s))  Urinalysis, Routine w reflex microscopic (not at [facility])     Status: Abnormal   Collection Time: 12/15/15 11:12 AM   Result Value Ref Range   Color, Urine YELLOW YELLOW   APPearance CLEAR CLEAR   Specific Gravity, Urine >1.030 (H) 1.005 - 1.030   pH 5.5 5.0 - 8.0   Glucose, UA 100 (A) NEGATIVE mg/dL   Hgb urine dipstick NEGATIVE NEGATIVE   Bilirubin Urine NEGATIVE NEGATIVE   Ketones, ur NEGATIVE NEGATIVE mg/dL   Protein, ur NEGATIVE NEGATIVE mg/dL   Nitrite NEGATIVE NEGATIVE   Leukocytes, UA TRACE (A) NEGATIVE  Urine microscopic-add on     Status: Abnormal   Collection Time: 12/15/15 11:12 AM  Result Value Ref Range   Squamous Epithelial / LPF 0-5 (A) NONE SEEN   WBC, UA 0-5 0 - 5 WBC/hpf   RBC / HPF 0-5 0 - 5 RBC/hpf   Bacteria, UA RARE (A) NONE SEEN  Pregnancy, urine POC     Status: Abnormal   Collection Time: 12/15/15 11:44 AM  Result Value Ref Range   Preg Test, Ur POSITIVE (A) NEGATIVE  CBC     Status: Abnormal   Collection Time: 12/15/15 12:21 PM  Result Value Ref Range   WBC 7.2 4.0 - 10.5 K/uL   RBC 3.96 3.87 - 5.11 MIL/uL   Hemoglobin 12.1 12.0 - 15.0 g/dL   HCT 34.6 (L) 36.0 - 46.0 %   MCV 87.4 78.0 - 100.0 fL   MCH 30.6 26.0 - 34.0 pg   MCHC 35.0 30.0 - 36.0 g/dL   RDW 13.1 11.5 - 15.5 %   Platelets 219 150 - 400 K/uL  hCG, quantitative, pregnancy     Status: Abnormal   Collection Time: 12/15/15 12:21 PM  Result Value Ref Range   hCG, Beta Chain, Quant, S 106,052 (H) <5 mIU/mL  Wet prep, genital     Status: Abnormal   Collection Time: 12/15/15 12:51 PM  Result Value Ref Range   Yeast Wet Prep HPF POC NONE SEEN NONE SEEN   Trich, Wet Prep NONE SEEN NONE SEEN   Clue Cells Wet Prep HPF POC NONE SEEN NONE SEEN   WBC, Wet Prep HPF POC MODERATE (A) NONE SEEN   Sperm PRESENT        IMAGING US Ob Comp Less 14 Wks  Result Date: 12/15/2015 CLINICAL DATA:  Pelvic pain EXAM: OBSTETRIC <14 WK ULTRASOUND TECHNIQUE: Transabdominal ultrasound was performed for evaluation of the gestation as well as the maternal uterus and adnexal regions. COMPARISON:  None. FINDINGS: Intrauterine  gestational sac: Visualized Yolk sac:  Visualized Embryo:  Visualized Cardiac Activity: Visualized Heart Rate: 173 bpm MSD:   mm    w     d CRL:   23  mm   9 w 0 d                  [place] EDC: 07/19/2016 Subchorionic hemorrhage:  None visualized. Maternal uterus/adnexae: No adnexal masses.  No free fluid. IMPRESSION: Nine week intrauterine pregnancy. Fetal heart rate 173 beats per minute. No acute maternal findings. Electronically Signed   By: Rolm Baptise M.D.   On: 12/15/2015 13:45    MAU Management/MDM: Ordered labs and reviewed results.  Hgb wnl.  IUP on prenatal [place]. Discussed pregnancy associated fatigue with pt, will likely improve in second trimester. Rx for prenatal vitamin, encouraged pt to take daily.  Pt to contact [facility] to begin prenatal care as soon as possible.  Reviewed reasons to return to MAU.  Pt stable at time of discharge.  ASSESSMENT 1. Pregnancy related fatigue in first trimester   2. Pelvic pain affecting pregnancy   3. Abdominal pain during pregnancy   4. Normal IUP (intrauterine pregnancy) on prenatal ultrasound, first trimester     PLAN Discharge home   Medication List    TAKE these medications   acetaminophen 500 MG chewable tablet Commonly known as:  TYLENOL Chew 500 mg by mouth every 6 (six) hours as needed for pain.   Prenatal Vitamins 28-0.8 MG Tabs Take 1 tablet by mouth daily.      Follow-up Information    Prenatal provider of your choice .           Fatima Blank Certified Nurse-Midwife 12/15/2015  3:09 PM

## 2015-12-16 LAB — HIV ANTIBODY (ROUTINE TESTING W REFLEX): HIV Screen 4th Generation wRfx: NONREACTIVE

## 2015-12-16 LAB — GC/CHLAMYDIA PROBE AMP (~~LOC~~) NOT AT ARMC
Chlamydia: NEGATIVE
NEISSERIA GONORRHEA: NEGATIVE

## 2015-12-16 LAB — OB RESULTS CONSOLE GC/CHLAMYDIA
CHLAMYDIA, DNA PROBE: NEGATIVE
GC PROBE AMP, GENITAL: NEGATIVE

## 2016-02-16 LAB — OB RESULTS CONSOLE ABO/RH: RH TYPE: POSITIVE

## 2016-02-16 LAB — CYTOLOGY - PAP: PAP SMEAR: NEGATIVE

## 2016-02-16 LAB — CULTURE, OB URINE: URINE CULTURE, OB: NEGATIVE

## 2016-02-16 LAB — OB RESULTS CONSOLE RUBELLA ANTIBODY, IGM: RUBELLA: IMMUNE

## 2016-02-16 LAB — OB RESULTS CONSOLE ANTIBODY SCREEN: ANTIBODY SCREEN: NEGATIVE

## 2016-02-16 LAB — OB RESULTS CONSOLE HEPATITIS B SURFACE ANTIGEN: HEP B S AG: NEGATIVE

## 2016-02-16 LAB — OB RESULTS CONSOLE HIV ANTIBODY (ROUTINE TESTING): HIV: NONREACTIVE

## 2016-02-16 LAB — OB RESULTS CONSOLE VARICELLA ZOSTER ANTIBODY, IGG: Varicella: IMMUNE

## 2016-02-16 LAB — OB RESULTS CONSOLE RPR: RPR: NONREACTIVE

## 2016-02-16 LAB — GLUCOSE, 1 HOUR: Glucose 1 Hour: 143

## 2016-02-23 LAB — GLUCOSE, 3 HOUR: Glucose, 3 hour: 98

## 2016-02-27 ENCOUNTER — Encounter: Payer: Self-pay | Admitting: *Deleted

## 2016-03-01 ENCOUNTER — Ambulatory Visit: Payer: Medicaid Other | Admitting: *Deleted

## 2016-03-01 ENCOUNTER — Encounter: Payer: Self-pay | Admitting: *Deleted

## 2016-03-01 ENCOUNTER — Encounter: Payer: Medicaid Other | Attending: Obstetrics and Gynecology | Admitting: Dietician

## 2016-03-01 ENCOUNTER — Other Ambulatory Visit: Payer: Self-pay | Admitting: *Deleted

## 2016-03-01 DIAGNOSIS — O24419 Gestational diabetes mellitus in pregnancy, unspecified control: Secondary | ICD-10-CM | POA: Insufficient documentation

## 2016-03-01 MED ORDER — GLUCOSE BLOOD VI STRP: ORAL_STRIP | 12 refills | Status: DC

## 2016-03-01 MED ORDER — ACCU-CHEK AVIVA PLUS W/DEVICE KIT: 1.0000 | PACK | Freq: Once | 0 refills | Status: AC

## 2016-03-01 MED ORDER — ACCU-CHEK SOFT TOUCH LANCETS MISC: 1.0000 | Freq: Four times a day (QID) | 12 refills | Status: DC

## 2016-03-01 NOTE — Progress Notes (Signed)
Diabetes Education: 03/01/16 Jill Ray is an [place] speaking lady from [place]. She is a G3P2, EDD: 07/17/16 and [redacted]w[redacted]d.  Gives no previous h/x of GDM or a family h/x if type 2 diabetes.  Provided handout "Nutrition, Diabetes and Pregnancy" and Carb Counting Sheet.  Review of GDM and the possible complications for the mother and the baby. Review of post partem measures to prevent the development of type 2 diabetes .Review of factors affecting blood glucose. Review of need to daily exercise/walk.  Advised to walk 30 minutes each day.  Or, can walk 15 minutes after each meal. Review of blood glucose monitoring.  Instructed to test glucose levels fasting and 2 hr post prandial.  Record and bring meter and glucose log to all clinic appointments.  On blood glucose demonstration, her glucose post breakfast this morning was 158 mg/dl.   She has [place] Medicaid coverage and will use the [facility] pharmacy on [address] to post electronic prescription. Review of food groups, carbohydrate foods, carb servings sizes, carb counting, meal and snack planning. Will follow as needed.   Maggie Tamsen Reist, RN, RD, LDN

## 2016-03-08 ENCOUNTER — Encounter: Payer: Self-pay | Admitting: *Deleted

## 2016-03-08 ENCOUNTER — Ambulatory Visit (INDEPENDENT_AMBULATORY_CARE_PROVIDER_SITE_OTHER): Payer: Medicaid Other | Admitting: Obstetrics & Gynecology

## 2016-03-08 ENCOUNTER — Encounter: Payer: Self-pay | Admitting: Obstetrics & Gynecology

## 2016-03-08 DIAGNOSIS — O0992 Supervision of high risk pregnancy, unspecified, second trimester: Secondary | ICD-10-CM | POA: Diagnosis not present

## 2016-03-08 DIAGNOSIS — O0990 Supervision of high risk pregnancy, unspecified, unspecified trimester: Secondary | ICD-10-CM | POA: Insufficient documentation

## 2016-03-08 LAB — POCT URINALYSIS DIP (DEVICE)
BILIRUBIN URINE: NEGATIVE
Glucose, UA: NEGATIVE mg/dL
Hgb urine dipstick: NEGATIVE
Ketones, ur: 80 mg/dL — AB
Leukocytes, UA: NEGATIVE
NITRITE: NEGATIVE
PH: 5.5 (ref 5.0–8.0)
Protein, ur: NEGATIVE mg/dL
Specific Gravity, Urine: 1.025 (ref 1.005–1.030)
Urobilinogen, UA: 0.2 mg/dL (ref 0.0–1.0)

## 2016-03-08 NOTE — Progress Notes (Signed)
  Subjective:    Jill Ray is a [id number] [redacted]w[redacted]d being seen today for her first obstetrical visit.  Her obstetrical history is significant for diabetes diagnosed in second trimester. Patient does intend to breast feed. Pregnancy history fully reviewed.  Patient reports no complaints.  Vitals:   03/08/16 1306  BP: (!) 111/59  Pulse: 97  Weight: 171 lb 8 oz (77.8 kg)    HISTORY: OB History  Gravida Para Term Preterm AB Living  3 2 2  0 0 2  SAB TAB Ectopic Multiple Live Births  0 0 0 0 2    # Outcome Date GA Lbr Len/2nd Weight Sex Delivery Anes PTL Lv  3 Current           2 Term 07/10/12 [redacted]w[redacted]d 17:44 / 00:23 8 lb 9.9 oz (3.91 kg) M Vag-Spont None  LIV  1 Term 04/05/09 [redacted]w[redacted]d 20:00 6 lb 13.4 oz (3.1 kg) M Vag-Spont EPI N LIV     Past Medical History:  Diagnosis Date  . Anemia   . Asthma   . Gallstone pancreatitis 08/08/2012   Archie Endo 08/08/2012  . Gallstone pancreatitis 08/14/2012  . Umbilical hernia    hernia (reducible umbilical hernia) is present. Archie Endo 08/08/2012  . Urinary tract infection    GBS in urine current preg   Past Surgical History:  Procedure Laterality Date  . CHOLECYSTECTOMY N/A 08/11/2012   Procedure: LAPAROSCOPIC CHOLECYSTECTOMY;  Surgeon: Rolm Bookbinder, MD;  Location: [place];  Service: General;  Laterality: N/A;  . ERCP N/A 08/13/2012   Procedure: ENDOSCOPIC RETROGRADE CHOLANGIOPANCREATOGRAPHY (ERCP);  Surgeon: Jeryl Columbia, MD;  Location: [facility];  Service: Endoscopy;  Laterality: N/A;  . NO PAST SURGERIES    . SPHINCTEROTOMY  08/13/2012   Procedure: SPHINCTEROTOMY;  Surgeon: Jeryl Columbia, MD;  Location: [facility];  Service: Endoscopy;;   Family History  Problem Relation Age of Onset  . Asthma Maternal Grandfather   . Hypotension Mother   . Other Neg Hx      Exam    Uterus:     Pelvic Exam:                                    Skin: normal coloration and turgor, no rashes    Neurologic: oriented, normal mood   Extremities: normal strength,  tone, and muscle mass   HEENT neck supple with midline trachea   Mouth/Teeth dental hygiene good   Neck supple   Cardiovascular: regular rate and rhythm   Respiratory:  appears well, vitals normal, no respiratory distress, acyanotic, normal RR   Abdomen: gravid c/w dates   Urinary:        Assessment:    Pregnancy: [id number] Patient Active Problem List   Diagnosis Date Noted  . Supervision of high-risk pregnancy 03/08/2016  . Diabetes mellitus complicating pregnancy, antepartum 03/01/2016  . Gallstone pancreatitis 08/14/2012        Plan:     Initial labs drawn. Prenatal vitamins. Problem list reviewed and updated. Genetic Screening need records from the HD Follow up in 2 weeks. 50% of 30 min visit spent on counseling and coordination of care.  BG testing started, bring her results    Ferol Laiche 03/08/2016

## 2016-03-08 NOTE — Progress Notes (Signed)
Here for first visit. Transferred from Health department. Given new patient education. Records were scanned , but did not come through. Called health department and asked them to resend all prenatal records.

## 2016-03-19 DIAGNOSIS — O24919 Unspecified diabetes mellitus in pregnancy, unspecified trimester: Secondary | ICD-10-CM

## 2016-03-29 ENCOUNTER — Ambulatory Visit (INDEPENDENT_AMBULATORY_CARE_PROVIDER_SITE_OTHER): Payer: Medicaid Other | Admitting: Obstetrics & Gynecology

## 2016-03-29 DIAGNOSIS — O0992 Supervision of high risk pregnancy, unspecified, second trimester: Secondary | ICD-10-CM

## 2016-03-29 DIAGNOSIS — Z8719 Personal history of other diseases of the digestive system: Secondary | ICD-10-CM

## 2016-03-29 LAB — POCT URINALYSIS DIP (DEVICE)
Bilirubin Urine: NEGATIVE
Glucose, UA: NEGATIVE mg/dL
HGB URINE DIPSTICK: NEGATIVE
Ketones, ur: NEGATIVE mg/dL
NITRITE: NEGATIVE
PH: 6.5 (ref 5.0–8.0)
PROTEIN: NEGATIVE mg/dL
UROBILINOGEN UA: 0.2 mg/dL (ref 0.0–1.0)

## 2016-03-29 LAB — GLUCOSE, CAPILLARY: Glucose-Capillary: 102 mg/dL — ABNORMAL HIGH (ref 65–99)

## 2016-03-29 MED ORDER — GLYBURIDE 1.25 MG PO TABS: ORAL_TABLET | ORAL | 3 refills | Status: DC

## 2016-03-29 NOTE — Progress Notes (Signed)
   PRENATAL VISIT NOTE  Subjective:  Jill Ray is a 35 y.o. G3P2002 at [redacted]w[redacted]d being seen today for ongoing prenatal care.  She is currently monitored for the following issues for this high-risk pregnancy and has Gallstone pancreatitis; Diabetes mellitus complicating pregnancy, antepartum; and Supervision of high-risk pregnancy on her problem list.  Patient reports rare contraction, about 1x per day.  Contractions: Irritability. Vag. Bleeding: None.  Movement: Present. Denies leaking of fluid.   The following portions of the patient's history were reviewed and updated as appropriate: allergies, current medications, past family history, past medical history, past social history, past surgical history and problem list. Problem list updated.  Objective:   Vitals:   03/29/16 0923  BP: 116/69  Pulse: (!) 104  Weight: 172 lb (78 kg)    Fetal Status: Fetal Heart Rate (bpm): 155   Movement: Present     General:  Alert, oriented and cooperative. Patient is in no acute distress.  Skin: Skin is warm and dry. No rash noted.   Cardiovascular: Normal heart rate noted  Respiratory: Normal respiratory effort, no problems with respiration noted  Abdomen: Soft, gravid, appropriate for gestational age. Pain/Pressure: Absent     Pelvic:  Cervical exam deferred        Extremities: Normal range of motion.  Edema: None  Mental Status: Normal mood and affect. Normal behavior. Normal judgment and thought content.   Assessment and Plan:  Pregnancy: G3P2002 at [redacted]w[redacted]d  1. Supervision of high risk pregnancy in second trimester  2.  GDM Fastings100-134 2 hr breakfast--125,128,120,128,112,116 2 hf lunch--136,139,114,122,143 2 hr dinner--135,135,117,127,118,137  Will start glyburide every 12 hours (appromx 10 am and 10 pm--pt doesn't eat breakfast until 10 am most days).  Preterm labor symptoms and general obstetric precautions including but not limited to vaginal bleeding, contractions, leaking of  fluid and fetal movement were reviewed in detail with the patient. Please refer to After Visit Summary for other counseling recommendations.   1 week to see how response to glyburide.  Guss Bunde, MD

## 2016-03-29 NOTE — Progress Notes (Signed)
Patient reports occasional contractions

## 2016-03-31 ENCOUNTER — Encounter: Payer: Self-pay | Admitting: General Practice

## 2016-03-31 DIAGNOSIS — O0992 Supervision of high risk pregnancy, unspecified, second trimester: Secondary | ICD-10-CM

## 2016-04-06 ENCOUNTER — Ambulatory Visit (INDEPENDENT_AMBULATORY_CARE_PROVIDER_SITE_OTHER): Payer: Self-pay | Admitting: Clinical

## 2016-04-06 ENCOUNTER — Ambulatory Visit (INDEPENDENT_AMBULATORY_CARE_PROVIDER_SITE_OTHER): Payer: Medicaid Other | Admitting: Obstetrics and Gynecology

## 2016-04-06 VITALS — BP 110/63 | HR 108 | Wt 175.0 lb

## 2016-04-06 DIAGNOSIS — F4321 Adjustment disorder with depressed mood: Secondary | ICD-10-CM

## 2016-04-06 DIAGNOSIS — O34219 Maternal care for unspecified type scar from previous cesarean delivery: Secondary | ICD-10-CM

## 2016-04-06 DIAGNOSIS — O0992 Supervision of high risk pregnancy, unspecified, second trimester: Secondary | ICD-10-CM

## 2016-04-06 DIAGNOSIS — Z8719 Personal history of other diseases of the digestive system: Secondary | ICD-10-CM

## 2016-04-06 DIAGNOSIS — O24912 Unspecified diabetes mellitus in pregnancy, second trimester: Secondary | ICD-10-CM

## 2016-04-06 DIAGNOSIS — O24919 Unspecified diabetes mellitus in pregnancy, unspecified trimester: Secondary | ICD-10-CM

## 2016-04-06 LAB — POCT URINALYSIS DIP (DEVICE)
Bilirubin Urine: NEGATIVE
GLUCOSE, UA: NEGATIVE mg/dL
Hgb urine dipstick: NEGATIVE
Ketones, ur: NEGATIVE mg/dL
NITRITE: NEGATIVE
PROTEIN: NEGATIVE mg/dL
Specific Gravity, Urine: 1.02 (ref 1.005–1.030)
UROBILINOGEN UA: 0.2 mg/dL (ref 0.0–1.0)
pH: 7.5 (ref 5.0–8.0)

## 2016-04-06 MED ORDER — GLYBURIDE 1.25 MG PO TABS: ORAL_TABLET | ORAL | 3 refills | Status: DC

## 2016-04-06 MED ORDER — ASPIRIN EC 81 MG PO TBEC: 81.0000 mg | DELAYED_RELEASE_TABLET | Freq: Every day | ORAL | 3 refills | Status: DC

## 2016-04-06 NOTE — BH Specialist Note (Signed)
Session Start time: 1:20   End Time: 1:45 Total Time:  25 minutes Type of Service: Orr Interpreter: No.   Interpreter Name & Language: n/a # [facility] Visits July 2017-June 2018: 1st   SUBJECTIVE: Jill Ray is a 35 y.o. female  Pt. was referred by Dr Elly Modena for:  depression. Pt. reports the following symptoms/concerns: Pt states that, although she often feels tired, sometimes wakes too early(and cannot go back to sleep) and worries, she feels she is functioning well. She would like her mother to come to the [place] to be with her during this pregnancy, as she has no other family but husband and children here, but says she does have friends who have been supportive. She copes by calling friends locally and family in [place], on a routine basis, and walks around her house when it is too cold to go outside. Pt uninterested in [facility] meds. Duration of problem:  Less than 3 months(current pregnancy) Severity: moderately severe Previous treatment: none   OBJECTIVE: Mood: Appropriate & Affect: Appropriate Risk of harm to self or others: No known risk of harm to self or others Assessments administered: PHQ9: 15/ GAD7: 4  LIFE CONTEXT:  Family & Social: Lives with husband and three children(husband's 26yo son, their 7 and almost 18yo children); remainder of family in [place], sports, many friends local School/ Work: Undetermined Self-Care: Walks daily, Sometimes gets less than ideal sleep(watches TV until tired, if needed), no particular eating issues, no substances Life changes: Current pregnancy What is important to pt/family (values): In the [place] 9 years, family connections are important   GOALS ADDRESSED:  -Alleviate symptoms of depression  INTERVENTIONS: Strength-based, Supportive and Family Systems   ASSESSMENT:  Pt currently experiencing Adjustment disorder with depressed mood.  Pt may benefit from psychoeducation and brief therapeutic interventions regarding  coping with symptoms of anxiety and depression.      PLAN: 1. F/U with behavioral health clinician: One month, or earlier as needed 2. Behavioral Health meds: none 3. Behavioral recommendations:  -Continue calling family in [place] weekly -Continue calling supportive friends, as needed, throughout the week -Continue daily walks(including inside the house on cold or rainy days) -Consider sound app prior to bed for improved sleep and additional self-care -Read educational material regarding coping with symptoms of depression -Consider [employer] for classes and workshops for women -Consider discussing Postpartum Planner with friends, to determine who is available for practical support postpartum 4. Referral: Brief Counseling/Psychotherapy, Data processing manager and Psychoeducation 5. From scale of 1-10, how likely are you to follow plan: [place]:   Warm Hand Off Completed.        Depression screen PHQ 2/9 04/06/2016  Decreased Interest 1  Down, Depressed, Hopeless 1  PHQ - 2 Score 2  Altered sleeping 2  Tired, decreased energy 2  Change in appetite 3  Feeling bad or failure about yourself  1  Trouble concentrating 3  Moving slowly or fidgety/restless 2  Suicidal thoughts 0  PHQ-9 Score 15   GAD 7 : Generalized Anxiety Score 04/06/2016  Nervous, Anxious, on Edge 1  Control/stop worrying 1  Worry too much - different things 1  Trouble relaxing 0  Restless 0  Easily annoyed or irritable 0  Afraid - awful might happen 1  Total GAD 7 Score 4

## 2016-04-06 NOTE — Progress Notes (Signed)
   PRENATAL VISIT NOTE  Subjective:  Jill Ray is a 35 y.o. G3P2002 at [redacted]w[redacted]d being seen today for ongoing prenatal care.  She is currently monitored for the following issues for this high-risk pregnancy and has Diabetes mellitus complicating pregnancy, antepartum; Supervision of high-risk pregnancy; and History of acute pancreatitis on her problem list.  Patient reports no complaints.  Contractions: Not present. Vag. Bleeding: None.  Movement: Present. Denies leaking of fluid.   The following portions of the patient's history were reviewed and updated as appropriate: allergies, current medications, past family history, past medical history, past social history, past surgical history and problem list. Problem list updated.  Objective:   Vitals:   04/06/16 1300  BP: 110/63  Pulse: (!) 108  Weight: 79.4 kg (175 lb)    Fetal Status: Fetal Heart Rate (bpm): 160   Movement: Present     General:  Alert, oriented and cooperative. Patient is in no acute distress.  Skin: Skin is warm and dry. No rash noted.   Cardiovascular: Normal heart rate noted  Respiratory: Normal respiratory effort, no problems with respiration noted  Abdomen: Soft, gravid, appropriate for gestational age. Pain/Pressure: Absent     Pelvic:  Cervical exam deferred        Extremities: Normal range of motion.  Edema: None  Mental Status: Normal mood and affect. Normal behavior. Normal judgment and thought content.   Assessment and Plan:  Pregnancy: G3P2002 at [redacted]w[redacted]d  1. Supervision of high risk pregnancy in second trimester Patient is doing well without complaints   2. History of acute pancreatitis   3. Diabetes mellitus complicating pregnancy, antepartum CBGs reviewed and all fasting remain elevated 109-115. Postprandial values are all within range. Patient is not consuming a bedtime snack  Diet reviewed with the patient. Advised patient to consume a snack for the next 2 nights, if no improvement in her  fasting (meaning less than 90), she was instructed to increase her glyburide to 2.5 mg at bedtime. Patient verbalized understanding Baseline labs ordered Fetal echo  Preterm labor symptoms and general obstetric precautions including but not limited to vaginal bleeding, contractions, leaking of fluid and fetal movement were reviewed in detail with the patient. Please refer to After Visit Summary for other counseling recommendations.  No Follow-up on file.   Mora Bellman, MD

## 2016-04-07 LAB — PROTEIN / CREATININE RATIO, URINE
Creatinine, Urine: 73 mg/dL (ref 20–320)
Protein Creatinine Ratio: 274 mg/g creat — ABNORMAL HIGH (ref 21–161)
TOTAL PROTEIN, URINE: 20 mg/dL (ref 5–24)

## 2016-04-19 ENCOUNTER — Ambulatory Visit (INDEPENDENT_AMBULATORY_CARE_PROVIDER_SITE_OTHER): Payer: Medicaid Other | Admitting: Obstetrics & Gynecology

## 2016-04-19 VITALS — BP 107/61 | HR 100 | Wt 175.0 lb

## 2016-04-19 DIAGNOSIS — O24919 Unspecified diabetes mellitus in pregnancy, unspecified trimester: Secondary | ICD-10-CM | POA: Diagnosis not present

## 2016-04-19 DIAGNOSIS — Z23 Encounter for immunization: Secondary | ICD-10-CM

## 2016-04-19 DIAGNOSIS — O0993 Supervision of high risk pregnancy, unspecified, third trimester: Secondary | ICD-10-CM

## 2016-04-19 LAB — CBC
HCT: 32.8 % — ABNORMAL LOW (ref 35.0–45.0)
Hemoglobin: 10.9 g/dL — ABNORMAL LOW (ref 11.7–15.5)
MCH: 31.7 pg (ref 27.0–33.0)
MCHC: 33.2 g/dL (ref 32.0–36.0)
MCV: 95.3 fL (ref 80.0–100.0)
MPV: 11.6 fL (ref 7.5–12.5)
PLATELETS: 185 10*3/uL (ref 140–400)
RBC: 3.44 MIL/uL — AB (ref 3.80–5.10)
RDW: 14.1 % (ref 11.0–15.0)
WBC: 7.1 10*3/uL (ref 3.8–10.8)

## 2016-04-19 LAB — POCT URINALYSIS DIP (DEVICE)
Bilirubin Urine: NEGATIVE
HGB URINE DIPSTICK: NEGATIVE
Ketones, ur: NEGATIVE mg/dL
NITRITE: NEGATIVE
PROTEIN: NEGATIVE mg/dL
Specific Gravity, Urine: 1.01 (ref 1.005–1.030)
UROBILINOGEN UA: 0.2 mg/dL (ref 0.0–1.0)
pH: 6.5 (ref 5.0–8.0)

## 2016-04-19 MED ORDER — GLUCOSE BLOOD VI STRP: ORAL_STRIP | 12 refills | Status: DC

## 2016-04-19 MED ORDER — GLYBURIDE 1.25 MG PO TABS: ORAL_TABLET | ORAL | 3 refills | Status: DC

## 2016-04-19 NOTE — Progress Notes (Signed)
   PRENATAL VISIT NOTE  Subjective:  Jill Ray is a 35 y.o. G3P2002 at [redacted]w[redacted]d being seen today for ongoing prenatal care.  She is currently monitored for the following issues for this high-risk pregnancy and has Diabetes mellitus complicating pregnancy, antepartum; Supervision of high-risk pregnancy; and History of acute pancreatitis on her problem list.  Patient reports no complaints.  Contractions: Irritability. Vag. Bleeding: None.  Movement: Present. Denies leaking of fluid.   The following portions of the patient's history were reviewed and updated as appropriate: allergies, current medications, past family history, past medical history, past social history, past surgical history and problem list. Problem list updated.  Objective:   Vitals:   04/19/16 1051  BP: 107/61  Pulse: 100  Weight: 175 lb (79.4 kg)    Fetal Status: Fetal Heart Rate (bpm): 155   Movement: Present     General:  Alert, oriented and cooperative. Patient is in no acute distress.  Skin: Skin is warm and dry. No rash noted.   Cardiovascular: Normal heart rate noted  Respiratory: Normal respiratory effort, no problems with respiration noted  Abdomen: Soft, gravid, appropriate for gestational age. Pain/Pressure: Absent     Pelvic:  Cervical exam deferred        Extremities: Normal range of motion.  Edema: None  Mental Status: Normal mood and affect. Normal behavior. Normal judgment and thought content.   Assessment and Plan:  Pregnancy: G3P2002 at [redacted]w[redacted]d  1. Supervision of high risk pregnancy in third trimester - CBC - RPR - HIV antibody (with reflex) - Tdap vaccine greater than or equal to 7yo IM  2. WORSENING Diabetes mellitus complicating pregnancy, antepartum Fastings 91-119 2 hr pp break--95-120 2 hr pp lunch 83-132 2 hr pp dinner 117-131  Increase glyburide to 3.75 mg QHS; continue 1.25 mg q am but may need to increase soon.  - [place] MFM OB FOLLOW UP; Future  Preterm labor symptoms and  general obstetric precautions including but not limited to vaginal bleeding, contractions, leaking of fluid and fetal movement were reviewed in detail with the patient. Please refer to After Visit Summary for other counseling recommendations.  Return in 10 days (on 04/29/2016).   Guss Bunde, MD

## 2016-04-20 LAB — HIV ANTIBODY (ROUTINE TESTING W REFLEX): HIV 1&2 Ab, 4th Generation: NONREACTIVE

## 2016-04-20 LAB — RPR

## 2016-04-28 ENCOUNTER — Ambulatory Visit (INDEPENDENT_AMBULATORY_CARE_PROVIDER_SITE_OTHER): Payer: Medicaid Other | Admitting: Obstetrics and Gynecology

## 2016-04-28 VITALS — BP 117/64 | HR 103 | Wt 174.9 lb

## 2016-04-28 DIAGNOSIS — O24919 Unspecified diabetes mellitus in pregnancy, unspecified trimester: Secondary | ICD-10-CM

## 2016-04-28 DIAGNOSIS — O0993 Supervision of high risk pregnancy, unspecified, third trimester: Secondary | ICD-10-CM

## 2016-04-28 DIAGNOSIS — O24913 Unspecified diabetes mellitus in pregnancy, third trimester: Secondary | ICD-10-CM

## 2016-04-28 LAB — POCT URINALYSIS DIP (DEVICE)
Bilirubin Urine: NEGATIVE
Glucose, UA: NEGATIVE mg/dL
KETONES UR: NEGATIVE mg/dL
Nitrite: NEGATIVE
PROTEIN: 30 mg/dL — AB
SPECIFIC GRAVITY, URINE: 1.02 (ref 1.005–1.030)
Urobilinogen, UA: 1 mg/dL (ref 0.0–1.0)
pH: 6 (ref 5.0–8.0)

## 2016-04-28 NOTE — Progress Notes (Signed)
   PRENATAL VISIT NOTE  Subjective:  Jill Ray is a 35 y.o. G3P2002 at [redacted]w[redacted]d being seen today for ongoing prenatal care.  She is currently monitored for the following issues for this high-risk pregnancy and has Diabetes mellitus complicating pregnancy, antepartum; Supervision of high-risk pregnancy; and History of acute pancreatitis on her problem list.  Patient reports vaginitis for the past 2 days. She reports the presence of a white discharge without odor, accompanied by pruritis.  Contractions: Not present. Vag. Bleeding: None.  Movement: Present. Denies leaking of fluid.   The following portions of the patient's history were reviewed and updated as appropriate: allergies, current medications, past family history, past medical history, past social history, past surgical history and problem list. Problem list updated.  Objective:   Vitals:   04/28/16 1303  BP: 117/64  Pulse: (!) 103  Weight: 174 lb 14.4 oz (79.3 kg)    Fetal Status: Fetal Heart Rate (bpm): 140   Movement: Present     General:  Alert, oriented and cooperative. Patient is in no acute distress.  Skin: Skin is warm and dry. No rash noted.   Cardiovascular: Normal heart rate noted  Respiratory: Normal respiratory effort, no problems with respiration noted  Abdomen: Soft, gravid, appropriate for gestational age. Pain/Pressure: Absent     Pelvic:  Cervical exam deferred        Extremities: Normal range of motion.  Edema: None  Mental Status: Normal mood and affect. Normal behavior. Normal judgment and thought content.   Assessment and Plan:  Pregnancy: G3P2002 at [redacted]w[redacted]d  1. Supervision of high risk pregnancy in third trimester Patient is doing well. Wet prep collected.  - Wet prep, genital - Culture, OB Urine  2. Diabetes mellitus complicating pregnancy, antepartum Patient has not been checking CBGs for the past 7 days due to the inability to obtain testing strips (there is an issue with her insurance and  the out of pocket cost is $100). She states that she was able to test it one day and fasting was 78. Advised patient to continue taking glyburide and adhering to the diet.  Care management was contacted for assistance Plan is to have patient come in on 1/8 to meet with diabetic educator for CBG review on new glyburide dosage Will start twice weekly NST at 32 weeks  Preterm labor symptoms and general obstetric precautions including but not limited to vaginal bleeding, contractions, leaking of fluid and fetal movement were reviewed in detail with the patient. Please refer to After Visit Summary for other counseling recommendations.  No Follow-up on file.   Mora Bellman, MD

## 2016-04-28 NOTE — Progress Notes (Signed)
Patient states she hasn't been able to check her blood sugars or take her medication since the 20th due to issues with insurance- will call [facility] Patient also reports white d/c that comes and goes with intense vaginal itching - last episode was 2 days ago

## 2016-04-29 ENCOUNTER — Telehealth: Payer: Self-pay | Admitting: *Deleted

## 2016-04-29 ENCOUNTER — Other Ambulatory Visit: Payer: Self-pay | Admitting: Obstetrics and Gynecology

## 2016-04-29 LAB — WET PREP, GENITAL
CLUE CELLS WET PREP: NONE SEEN
Trich, Wet Prep: NONE SEEN

## 2016-04-29 LAB — CULTURE, OB URINE: Organism ID, Bacteria: NO GROWTH

## 2016-04-29 MED ORDER — FLUCONAZOLE 150 MG PO TABS: 150.0000 mg | ORAL_TABLET | Freq: Once | ORAL | 0 refills | Status: AC

## 2016-04-29 NOTE — Telephone Encounter (Signed)
Called pt to deliver results per request of Dr. Elly Modena. No answer and unable to leave message.  Pt needs to be informed of wet prep showing positive yeast. Rx for diflucan has been sent to her pharmacy.

## 2016-05-03 NOTE — L&D Delivery Note (Signed)
Delivery Note At 3:28 AM a viable female was delivered via Vaginal, Spontaneous Delivery (Presentation: LOA ; vertex ).  APGAR: 9, 9; weight  pending.   Placenta status: intact , spontaneously delivered.  Cord:  3 vessel with no  complications: .  Cord pH: n/a  Anesthesia:  epidural Episiotomy: None Lacerations: perineal 2nd degree, repaired; labial 1st degree Suture Repair: 3.0 monocryl Est. Blood Loss (mL): 100  Mom to postpartum.  Baby to Couplet care / Skin to Skin.  Allene Pyo do Lorra Hals MD PGY1 07/11/2016, 4:21 AM  Midwife attestation: I was gloved and present for delivery in its entirety and I agree with the above resident's note.  Fatima Blank, CNM 10:11 AM

## 2016-05-05 ENCOUNTER — Other Ambulatory Visit: Payer: Self-pay | Admitting: Obstetrics and Gynecology

## 2016-05-05 MED ORDER — FLUCONAZOLE 150 MG PO TABS: 150.0000 mg | ORAL_TABLET | Freq: Once | ORAL | 0 refills | Status: AC

## 2016-05-07 MED ORDER — TERCONAZOLE 0.4 % VA CREA: 1.0000 | TOPICAL_CREAM | Freq: Every day | VAGINAL | 0 refills | Status: DC

## 2016-05-07 NOTE — Telephone Encounter (Signed)
Attempted to contact pt and was unable to LM due phone kept ringing.  Terazol cream e-prescribed per protocol.  Note put in pt's appt notes to address with pt on 05/10/16.

## 2016-05-10 ENCOUNTER — Ambulatory Visit: Payer: Medicaid Other | Admitting: *Deleted

## 2016-05-10 ENCOUNTER — Encounter: Payer: Medicaid Other | Attending: Family Medicine | Admitting: *Deleted

## 2016-05-10 ENCOUNTER — Other Ambulatory Visit: Payer: Self-pay | Admitting: General Practice

## 2016-05-10 DIAGNOSIS — O24419 Gestational diabetes mellitus in pregnancy, unspecified control: Secondary | ICD-10-CM | POA: Diagnosis not present

## 2016-05-10 DIAGNOSIS — O24919 Unspecified diabetes mellitus in pregnancy, unspecified trimester: Secondary | ICD-10-CM

## 2016-05-10 NOTE — Progress Notes (Signed)
Blood Glucose Monitoring Follow Up Visit  Patient seen on 05/10/2016 for follow up of CBG. She speaks [place] and [place]. She states she forgot her Log Book today, but is familiar with what her BGs have been running. So her info is not documented today.  She states her FBG when tested before 7 AM is always under 95 mg/dl, but if she waits to test after 7 AM it is typically around 95-105 mg/dl even without any food intake.  She states her 2 hour post meal BG's are typically under 120 mg/dl with an occasionally BG of 135 and one time a 150 mg/dl.  She had questions about what foods were good sources of protein, which I reviewed with her. We also discussed fruit juice choices and why they need to be avoided at this time.   She states she has a Doctor, hospital but is charged $100 for strips which she cannot afford, so I provided her with a box of 50 strips today.   She states she is taking her Glyburide 12 hours apart, after breakfast and if she's still awake, after dinner. I suggested she start taking it before breakfast and before her dinner meal as it increases her insulin production and may better match the glucose rise in her BG if taken before these meals. She will try this and re-assess at her next MD visit.  Follow up as needed

## 2016-05-14 ENCOUNTER — Ambulatory Visit (HOSPITAL_COMMUNITY)
Admission: RE | Admit: 2016-05-14 | Discharge: 2016-05-14 | Disposition: A | Discharge status: Home / Self Care | Payer: Medicaid Other | Source: Ambulatory Visit | Attending: Obstetrics & Gynecology | Admitting: Obstetrics & Gynecology

## 2016-05-14 ENCOUNTER — Encounter (HOSPITAL_COMMUNITY): Payer: Self-pay

## 2016-05-14 DIAGNOSIS — Z3A30 30 weeks gestation of pregnancy: Secondary | ICD-10-CM | POA: Diagnosis not present

## 2016-05-14 DIAGNOSIS — O24919 Unspecified diabetes mellitus in pregnancy, unspecified trimester: Secondary | ICD-10-CM | POA: Diagnosis present

## 2016-05-17 ENCOUNTER — Encounter: Payer: Self-pay | Admitting: Obstetrics & Gynecology

## 2016-05-18 ENCOUNTER — Other Ambulatory Visit (HOSPITAL_COMMUNITY): Payer: Self-pay | Admitting: *Deleted

## 2016-05-18 DIAGNOSIS — O24415 Gestational diabetes mellitus in pregnancy, controlled by oral hypoglycemic drugs: Secondary | ICD-10-CM

## 2016-05-24 ENCOUNTER — Encounter: Payer: Self-pay | Admitting: Obstetrics & Gynecology

## 2016-05-24 ENCOUNTER — Ambulatory Visit (INDEPENDENT_AMBULATORY_CARE_PROVIDER_SITE_OTHER): Payer: Medicaid Other | Admitting: Family Medicine

## 2016-05-24 VITALS — BP 107/64 | HR 101 | Wt 179.2 lb

## 2016-05-24 DIAGNOSIS — O0993 Supervision of high risk pregnancy, unspecified, third trimester: Secondary | ICD-10-CM

## 2016-05-24 DIAGNOSIS — O24419 Gestational diabetes mellitus in pregnancy, unspecified control: Secondary | ICD-10-CM | POA: Diagnosis not present

## 2016-05-24 LAB — POCT URINALYSIS DIP (DEVICE)
BILIRUBIN URINE: NEGATIVE
Glucose, UA: 100 mg/dL — AB
Hgb urine dipstick: NEGATIVE
Ketones, ur: NEGATIVE mg/dL
NITRITE: NEGATIVE
Protein, ur: NEGATIVE mg/dL
Specific Gravity, Urine: 1.015 (ref 1.005–1.030)
Urobilinogen, UA: 0.2 mg/dL (ref 0.0–1.0)
pH: 5.5 (ref 5.0–8.0)

## 2016-05-24 MED ORDER — GLUCOSE BLOOD VI STRP: ORAL_STRIP | 12 refills | Status: DC

## 2016-05-24 MED ORDER — ACCU-CHEK SOFT TOUCH LANCETS MISC: 1.0000 | Freq: Four times a day (QID) | 12 refills | Status: DC

## 2016-05-24 NOTE — Progress Notes (Signed)
   PRENATAL VISIT NOTE  Subjective:  Jill Ray is a 36 y.o. G3P2002 at [redacted]w[redacted]d being seen today for ongoing prenatal care.  She is currently monitored for the following issues for this high-risk pregnancy and has Diabetes mellitus complicating pregnancy, antepartum; Supervision of high-risk pregnancy; and History of acute pancreatitis on her problem list.  Patient reports no complaints.  Contractions: Not present. Vag. Bleeding: None.  Movement: Present. Denies leaking of fluid.   The following portions of the patient's history were reviewed and updated as appropriate: allergies, current medications, past family history, past medical history, past social history, past surgical history and problem list. Problem list updated.  Objective:   Vitals:   05/24/16 0933  BP: 107/64  Pulse: (!) 101  Weight: 179 lb 3.2 oz (81.3 kg)    Fetal Status: Fetal Heart Rate (bpm): NST   Movement: Present     General:  Alert, oriented and cooperative. Patient is in no acute distress.  Skin: Skin is warm and dry. No rash noted.   Cardiovascular: Normal heart rate noted  Respiratory: Normal respiratory effort, no problems with respiration noted  Abdomen: Soft, gravid, appropriate for gestational age. Pain/Pressure: Absent     Pelvic:  Cervical exam deferred        Extremities: Normal range of motion.  Edema: None  Mental Status: Normal mood and affect. Normal behavior. Normal judgment and thought content.  FBS 74-109 2 hr pp 79-145 NST reviewed and reactive. Assessment and Plan:  Pregnancy: G3P2002 at [redacted]w[redacted]d  1. Gestational diabetes mellitus (GDM), antepartum, gestational diabetes method of control unspecified CBG's appear well controlled--no adjustments made. Refilled lancets and strips. - Lancets (ACCU-CHEK SOFT TOUCH) lancets; 1 each by Other route 4 (four) times daily.  Dispense: 100 each; Refill: 12 - glucose blood test strip; Use as instructed  Dispense: 100 each; Refill: 12  2.  Supervision of high risk pregnancy in third trimester Continue prenatal care.   Preterm labor symptoms and general obstetric precautions including but not limited to vaginal bleeding, contractions, leaking of fluid and fetal movement were reviewed in detail with the patient. Please refer to After Visit Summary for other counseling recommendations.  Return in 3 days (on 05/27/2016) for NST;  1/29  Ob fu and NST/AFI.   Donnamae Jude, MD

## 2016-05-24 NOTE — Patient Instructions (Signed)
Third Trimester of Pregnancy The third trimester is from week 29 through week 40 (months 7 through 9). The third trimester is a time when the unborn baby (fetus) is growing rapidly. At the end of the ninth month, the fetus is about 20 inches in length and weighs 6-10 pounds. Body changes during your third trimester Your body goes through many changes during pregnancy. The changes vary from woman to woman. During the third trimester:  Your weight will continue to increase. You can expect to gain 25-35 pounds (11-16 kg) by the end of the pregnancy.  You may begin to get stretch marks on your hips, abdomen, and breasts.  You may urinate more often because the fetus is moving lower into your pelvis and pressing on your bladder.  You may develop or continue to have heartburn. This is caused by increased hormones that slow down muscles in the digestive tract.  You may develop or continue to have constipation because increased hormones slow digestion and cause the muscles that push waste through your intestines to relax.  You may develop hemorrhoids. These are swollen veins (varicose veins) in the rectum that can itch or be painful.  You may develop swollen, bulging veins (varicose veins) in your legs.  You may have increased body aches in the pelvis, back, or thighs. This is due to weight gain and increased hormones that are relaxing your joints.  You may have changes in your hair. These can include thickening of your hair, rapid growth, and changes in texture. Some women also have hair loss during or after pregnancy, or hair that feels dry or thin. Your hair will most likely return to normal after your baby is born.  Your breasts will continue to grow and they will continue to become tender. A yellow fluid (colostrum) may leak from your breasts. This is the first milk you are producing for your baby.  Your belly button may stick out.  You may notice more swelling in your hands, face, or  ankles.  You may have increased tingling or numbness in your hands, arms, and legs. The skin on your belly may also feel numb.  You may feel short of breath because of your expanding uterus.  You may have more problems sleeping. This can be caused by the size of your belly, increased need to urinate, and an increase in your body's metabolism.  You may notice the fetus "dropping," or moving lower in your abdomen.  You may have increased vaginal discharge.  Your cervix becomes thin and soft (effaced) near your due date. What to expect at prenatal visits You will have prenatal exams every 2 weeks until week 36. Then you will have weekly prenatal exams. During a routine prenatal visit:  You will be weighed to make sure you and the fetus are growing normally.  Your blood pressure will be taken.  Your abdomen will be measured to track your baby's growth.  The fetal heartbeat will be listened to.  Any test results from the previous visit will be discussed.  You may have a cervical check near your due date to see if you have effaced. At around 36 weeks, your health care provider will check your cervix. At the same time, your health care provider will also perform a test on the secretions of the vaginal tissue. This test is to determine if a type of bacteria, Group B streptococcus, is present. Your health care provider will explain this further. Your health care provider may ask you:    What your birth plan is.  How you are feeling.  If you are feeling the baby move.  If you have had any abnormal symptoms, such as leaking fluid, bleeding, severe headaches, or abdominal cramping.  If you are using any tobacco products, including cigarettes, chewing tobacco, and electronic cigarettes.  If you have any questions. Other tests or screenings that may be performed during your third trimester include:  Blood tests that check for low iron levels (anemia).  Fetal testing to check the health,  activity level, and growth of the fetus. Testing is done if you have certain medical conditions or if there are problems during the pregnancy.  Nonstress test (NST). This test checks the health of your baby to make sure there are no signs of problems, such as the baby not getting enough oxygen. During this test, a belt is placed around your belly. The baby is made to move, and its heart rate is monitored during movement. What is false labor? False labor is a condition in which you feel small, irregular tightenings of the muscles in the womb (contractions) that eventually go away. These are called Braxton Hicks contractions. Contractions may last for hours, days, or even weeks before true labor sets in. If contractions come at regular intervals, become more frequent, increase in intensity, or become painful, you should see your health care provider. What are the signs of labor?  Abdominal cramps.  Regular contractions that start at 10 minutes apart and become stronger and more frequent with time.  Contractions that start on the top of the uterus and spread down to the lower abdomen and back.  Increased pelvic pressure and dull back pain.  A watery or bloody mucus discharge that comes from the vagina.  Leaking of amniotic fluid. This is also known as your "water breaking." It could be a slow trickle or a gush. Let your doctor know if it has a color or strange odor. If you have any of these signs, call your health care provider right away, even if it is before your due date. Follow these instructions at home: Eating and drinking  Continue to eat regular, healthy meals.  Do not eat:  Raw meat or meat spreads.  Unpasteurized milk or cheese.  Unpasteurized juice.  Store-made salad.  Refrigerated smoked seafood.  Hot dogs or deli meat, unless they are piping hot.  More than 6 ounces of albacore tuna a week.  Shark, swordfish, king mackerel, or tile fish.  Store-made salads.  Raw  sprouts, such as mung bean or alfalfa sprouts.  Take prenatal vitamins as told by your health care provider.  Take 1000 mg of calcium daily as told by your health care provider.  If you develop constipation:  Take over-the-counter or prescription medicines.  Drink enough fluid to keep your urine clear or pale yellow.  Eat foods that are high in fiber, such as fresh fruits and vegetables, whole grains, and beans.  Limit foods that are high in fat and processed sugars, such as fried and sweet foods. Activity  Exercise only as directed by your health care provider. Healthy pregnant women should aim for 2 hours and 30 minutes of moderate exercise per week. If you experience any pain or discomfort while exercising, stop.  Avoid heavy lifting.  Do not exercise in extreme heat or humidity, or at high altitudes.  Wear low-heel, comfortable shoes.  Practice good posture.  Do not travel far distances unless it is absolutely necessary and only with the approval   of your health care provider.  Wear your seat belt at all times while in a car, on a bus, or on a plane.  Take frequent breaks and rest with your legs elevated if you have leg cramps or low back pain.  Do not use hot tubs, steam rooms, or saunas.  You may continue to have sex unless your health care provider tells you otherwise. Lifestyle  Do not use any products that contain nicotine or tobacco, such as cigarettes and e-cigarettes. If you need help quitting, ask your health care provider.  Do not drink alcohol.  Do not use any medicinal herbs or unprescribed drugs. These chemicals affect the formation and growth of the baby.  If you develop varicose veins:  Wear support pantyhose or compression stockings as told by your healthcare provider.  Elevate your feet for 15 minutes, 3-4 times a day.  Wear a supportive maternity bra to help with breast tenderness. General instructions  Take over-the-counter and prescription  medicines only as told by your health care provider. There are medicines that are either safe or unsafe to take during pregnancy.  Take warm sitz baths to soothe any pain or discomfort caused by hemorrhoids. Use hemorrhoid cream or witch hazel if your health care provider approves.  Avoid cat litter boxes and soil used by cats. These carry germs that can cause birth defects in the baby. If you have a cat, ask someone to clean the litter box for you.  To prepare for the arrival of your baby:  Take prenatal classes to understand, practice, and ask questions about the labor and delivery.  Make a trial run to the hospital.  Visit the hospital and tour the maternity area.  Arrange for maternity or paternity leave through employers.  Arrange for family and friends to take care of pets while you are in the hospital.  Purchase a rear-facing car seat and make sure you know how to install it in your car.  Pack your hospital bag.  Prepare the baby's nursery. Make sure to remove all pillows and stuffed animals from the baby's crib to prevent suffocation.  Visit your dentist if you have not gone during your pregnancy. Use a soft toothbrush to brush your teeth and be gentle when you floss.  Keep all prenatal follow-up visits as told by your health care provider. This is important. Contact a health care provider if:  You are unsure if you are in labor or if your water has broken.  You become dizzy.  You have mild pelvic cramps, pelvic pressure, or nagging pain in your abdominal area.  You have lower back pain.  You have persistent nausea, vomiting, or diarrhea.  You have an unusual or bad smelling vaginal discharge.  You have pain when you urinate. Get help right away if:  You have a fever.  You are leaking fluid from your vagina.  You have spotting or bleeding from your vagina.  You have severe abdominal pain or cramping.  You have rapid weight loss or weight gain.  You have  shortness of breath with chest pain.  You notice sudden or extreme swelling of your face, hands, ankles, feet, or legs.  Your baby makes fewer than 10 movements in 2 hours.  You have severe headaches that do not go away with medicine.  You have vision changes. Summary  The third trimester is from week 29 through week 40, months 7 through 9. The third trimester is a time when the unborn baby (fetus)   is growing rapidly.  During the third trimester, your discomfort may increase as you and your baby continue to gain weight. You may have abdominal, leg, and back pain, sleeping problems, and an increased need to urinate.  During the third trimester your breasts will keep growing and they will continue to become tender. A yellow fluid (colostrum) may leak from your breasts. This is the first milk you are producing for your baby.  False labor is a condition in which you feel small, irregular tightenings of the muscles in the womb (contractions) that eventually go away. These are called Braxton Hicks contractions. Contractions may last for hours, days, or even weeks before true labor sets in.  Signs of labor can include: abdominal cramps; regular contractions that start at 10 minutes apart and become stronger and more frequent with time; watery or bloody mucus discharge that comes from the vagina; increased pelvic pressure and dull back pain; and leaking of amniotic fluid. This information is not intended to replace advice given to you by your health care provider. Make sure you discuss any questions you have with your health care provider. Document Released: 04/13/2001 Document Revised: 09/25/2015 Document Reviewed: 06/20/2012 [employer] Interactive Patient Education  2017 [employer]   Breastfeeding Deciding to breastfeed is one of the best choices you can make for you and your baby. A change in hormones during pregnancy causes your breast tissue to grow and increases the number and size of your  milk ducts. These hormones also allow proteins, sugars, and fats from your blood supply to make breast milk in your milk-producing glands. Hormones prevent breast milk from being released before your baby is born as well as prompt milk flow after birth. Once breastfeeding has begun, thoughts of your baby, as well as his or her sucking or crying, can stimulate the release of milk from your milk-producing glands. Benefits of breastfeeding For Your Baby  Your first milk (colostrum) helps your baby's digestive system function better.  There are antibodies in your milk that help your baby fight off infections.  Your baby has a lower incidence of asthma, allergies, and sudden infant death syndrome.  The nutrients in breast milk are better for your baby than infant formulas and are designed uniquely for your baby's needs.  Breast milk improves your baby's brain development.  Your baby is less likely to develop other conditions, such as childhood obesity, asthma, or type 2 diabetes mellitus. For You  Breastfeeding helps to create a very special bond between you and your baby.  Breastfeeding is convenient. Breast milk is always available at the correct temperature and costs nothing.  Breastfeeding helps to burn calories and helps you lose the weight gained during pregnancy.  Breastfeeding makes your uterus contract to its prepregnancy size faster and slows bleeding (lochia) after you give birth.  Breastfeeding helps to lower your risk of developing type 2 diabetes mellitus, osteoporosis, and breast or ovarian cancer later in life. Signs that your baby is hungry Early Signs of Hunger  Increased alertness or activity.  Stretching.  Movement of the head from side to side.  Movement of the head and opening of the mouth when the corner of the mouth or cheek is stroked (rooting).  Increased sucking sounds, smacking lips, cooing, sighing, or squeaking.  Hand-to-mouth movements.  Increased  sucking of fingers or hands. Late Signs of Hunger  Fussing.  Intermittent crying. Extreme Signs of Hunger  Signs of extreme hunger will require calming and consoling before your baby will   be able to breastfeed successfully. Do not wait for the following signs of extreme hunger to occur before you initiate breastfeeding:  Restlessness.  A loud, strong cry.  Screaming. Breastfeeding basics  Breastfeeding Initiation  Find a comfortable place to sit or lie down, with your neck and back well supported.  Place a pillow or rolled up blanket under your baby to bring him or her to the level of your breast (if you are seated). Nursing pillows are specially designed to help support your arms and your baby while you breastfeed.  Make sure that your baby's abdomen is facing your abdomen.  Gently massage your breast. With your fingertips, massage from your chest wall toward your nipple in a circular motion. This encourages milk flow. You may need to continue this action during the feeding if your milk flows slowly.  Support your breast with 4 fingers underneath and your thumb above your nipple. Make sure your fingers are well away from your nipple and your baby's mouth.  Stroke your baby's lips gently with your finger or nipple.  When your baby's mouth is open wide enough, quickly bring your baby to your breast, placing your entire nipple and as much of the colored area around your nipple (areola) as possible into your baby's mouth.  More areola should be visible above your baby's upper lip than below the lower lip.  Your baby's tongue should be between his or her lower gum and your breast.  Ensure that your baby's mouth is correctly positioned around your nipple (latched). Your baby's lips should create a seal on your breast and be turned out (everted).  It is common for your baby to suck about 2-3 minutes in order to start the flow of breast milk. Latching  Teaching your baby how to latch  on to your breast properly is very important. An improper latch can cause nipple pain and decreased milk supply for you and poor weight gain in your baby. Also, if your baby is not latched onto your nipple properly, he or she may swallow some air during feeding. This can make your baby fussy. Burping your baby when you switch breasts during the feeding can help to get rid of the air. However, teaching your baby to latch on properly is still the best way to prevent fussiness from swallowing air while breastfeeding. Signs that your baby has successfully latched on to your nipple:  Silent tugging or silent sucking, without causing you pain.  Swallowing heard between every 3-4 sucks.  Muscle movement above and in front of his or her ears while sucking. Signs that your baby has not successfully latched on to nipple:  Sucking sounds or smacking sounds from your baby while breastfeeding.  Nipple pain. If you think your baby has not latched on correctly, slip your finger into the corner of your baby's mouth to break the suction and place it between your baby's gums. Attempt breastfeeding initiation again. Signs of Successful Breastfeeding  Signs from your baby:  A gradual decrease in the number of sucks or complete cessation of sucking.  Falling asleep.  Relaxation of his or her body.  Retention of a small amount of milk in his or her mouth.  Letting go of your breast by himself or herself. Signs from you:  Breasts that have increased in firmness, weight, and size 1-3 hours after feeding.  Breasts that are softer immediately after breastfeeding.  Increased milk volume, as well as a change in milk consistency and color by   the fifth day of breastfeeding.  Nipples that are not sore, cracked, or bleeding. Signs That Your Baby is Getting Enough Milk  Wetting at least 1-2 diapers during the first 24 hours after birth.  Wetting at least 5-6 diapers every 24 hours for the first week after  birth. The urine should be clear or pale yellow by 5 days after birth.  Wetting 6-8 diapers every 24 hours as your baby continues to grow and develop.  At least 3 stools in a 24-hour period by age 5 days. The stool should be soft and yellow.  At least 3 stools in a 24-hour period by age 7 days. The stool should be seedy and yellow.  No loss of weight greater than 10% of birth weight during the first 3 days of age.  Average weight gain of 4-7 ounces (113-198 g) per week after age 4 days.  Consistent daily weight gain by age 5 days, without weight loss after the age of 2 weeks. After a feeding, your baby may spit up a small amount. This is common. Breastfeeding frequency and duration Frequent feeding will help you make more milk and can prevent sore nipples and breast engorgement. Breastfeed when you feel the need to reduce the fullness of your breasts or when your baby shows signs of hunger. This is called "breastfeeding on demand." Avoid introducing a pacifier to your baby while you are working to establish breastfeeding (the first 4-6 weeks after your baby is born). After this time you may choose to use a pacifier. Research has shown that pacifier use during the first year of a baby's life decreases the risk of sudden infant death syndrome (SIDS). Allow your baby to feed on each breast as long as he or she wants. Breastfeed until your baby is finished feeding. When your baby unlatches or falls asleep while feeding from the first breast, offer the second breast. Because newborns are often sleepy in the first few weeks of life, you may need to awaken your baby to get him or her to feed. Breastfeeding times will vary from baby to baby. However, the following rules can serve as a guide to help you ensure that your baby is properly fed:  Newborns (babies 4 weeks of age or younger) may breastfeed every 1-3 hours.  Newborns should not go longer than 3 hours during the day or 5 hours during the night  without breastfeeding.  You should breastfeed your baby a minimum of 8 times in a 24-hour period until you begin to introduce solid foods to your baby at around 6 months of age. Breast milk pumping Pumping and storing breast milk allows you to ensure that your baby is exclusively fed your breast milk, even at times when you are unable to breastfeed. This is especially important if you are going back to work while you are still breastfeeding or when you are not able to be present during feedings. Your lactation consultant can give you guidelines on how long it is safe to store breast milk. A breast pump is a machine that allows you to pump milk from your breast into a sterile bottle. The pumped breast milk can then be stored in a refrigerator or freezer. Some breast pumps are operated by hand, while others use electricity. Ask your lactation consultant which type will work best for you. Breast pumps can be purchased, but some hospitals and breastfeeding support groups lease breast pumps on a monthly basis. A lactation consultant can teach you how to   hand express breast milk, if you prefer not to use a pump. Caring for your breasts while you breastfeed Nipples can become dry, cracked, and sore while breastfeeding. The following recommendations can help keep your breasts moisturized and healthy:  Avoid using soap on your nipples.  Wear a supportive bra. Although not required, special nursing bras and tank tops are designed to allow access to your breasts for breastfeeding without taking off your entire bra or top. Avoid wearing underwire-style bras or extremely tight bras.  Air dry your nipples for 3-4minutes after each feeding.  Use only cotton bra pads to absorb leaked breast milk. Leaking of breast milk between feedings is normal.  Use lanolin on your nipples after breastfeeding. Lanolin helps to maintain your skin's normal moisture barrier. If you use pure lanolin, you do not need to wash it off  before feeding your baby again. Pure lanolin is not toxic to your baby. You may also hand express a few drops of breast milk and gently massage that milk into your nipples and allow the milk to air dry. In the first few weeks after giving birth, some women experience extremely full breasts (engorgement). Engorgement can make your breasts feel heavy, warm, and tender to the touch. Engorgement peaks within 3-5 days after you give birth. The following recommendations can help ease engorgement:  Completely empty your breasts while breastfeeding or pumping. You may want to start by applying warm, moist heat (in the shower or with warm water-soaked hand towels) just before feeding or pumping. This increases circulation and helps the milk flow. If your baby does not completely empty your breasts while breastfeeding, pump any extra milk after he or she is finished.  Wear a snug bra (nursing or regular) or tank top for 1-2 days to signal your body to slightly decrease milk production.  Apply ice packs to your breasts, unless this is too uncomfortable for you.  Make sure that your baby is latched on and positioned properly while breastfeeding. If engorgement persists after 48 hours of following these recommendations, contact your health care provider or a lactation consultant. Overall health care recommendations while breastfeeding  Eat healthy foods. Alternate between meals and snacks, eating 3 of each per day. Because what you eat affects your breast milk, some of the foods may make your baby more irritable than usual. Avoid eating these foods if you are sure that they are negatively affecting your baby.  Drink milk, fruit juice, and water to satisfy your thirst (about 10 glasses a day).  Rest often, relax, and continue to take your prenatal vitamins to prevent fatigue, stress, and anemia.  Continue breast self-awareness checks.  Avoid chewing and smoking tobacco. Chemicals from cigarettes that pass  into breast milk and exposure to secondhand smoke may harm your baby.  Avoid alcohol and drug use, including marijuana. Some medicines that may be harmful to your baby can pass through breast milk. It is important to ask your health care provider before taking any medicine, including all over-the-counter and prescription medicine as well as vitamin and herbal supplements. It is possible to become pregnant while breastfeeding. If birth control is desired, ask your health care provider about options that will be safe for your baby. Contact a health care provider if:  You feel like you want to stop breastfeeding or have become frustrated with breastfeeding.  You have painful breasts or nipples.  Your nipples are cracked or bleeding.  Your breasts are red, tender, or warm.  You have   a swollen area on either breast.  You have a fever or chills.  You have nausea or vomiting.  You have drainage other than breast milk from your nipples.  Your breasts do not become full before feedings by the fifth day after you give birth.  You feel sad and depressed.  Your baby is too sleepy to eat well.  Your baby is having trouble sleeping.  Your baby is wetting less than 3 diapers in a 24-hour period.  Your baby has less than 3 stools in a 24-hour period.  Your baby's skin or the white part of his or her eyes becomes yellow.  Your baby is not gaining weight by 5 days of age. Get help right away if:  Your baby is overly tired (lethargic) and does not want to wake up and feed.  Your baby develops an unexplained fever. This information is not intended to replace advice given to you by your health care provider. Make sure you discuss any questions you have with your health care provider. Document Released: 04/19/2005 Document Revised: 10/01/2015 Document Reviewed: 10/11/2012 Elsevier Interactive Patient Education  2017 Elsevier Inc.  

## 2016-05-24 NOTE — Addendum Note (Signed)
Addended by: Langston Reusing on: 05/24/2016 01:33 PM   Modules accepted: Orders

## 2016-05-27 ENCOUNTER — Ambulatory Visit (INDEPENDENT_AMBULATORY_CARE_PROVIDER_SITE_OTHER): Payer: Medicaid Other | Admitting: Family Medicine

## 2016-05-27 VITALS — BP 104/64 | HR 104

## 2016-05-27 DIAGNOSIS — O24419 Gestational diabetes mellitus in pregnancy, unspecified control: Secondary | ICD-10-CM | POA: Diagnosis not present

## 2016-05-27 MED ORDER — GLUCOSE BLOOD VI STRP: ORAL_STRIP | 12 refills | Status: DC

## 2016-05-27 NOTE — Progress Notes (Signed)
NST reactive.

## 2016-05-27 NOTE — Addendum Note (Signed)
Addended by: Langston Reusing on: 05/27/2016 03:19 PM   Modules accepted: Orders

## 2016-05-31 ENCOUNTER — Encounter: Payer: Self-pay | Admitting: Obstetrics & Gynecology

## 2016-05-31 ENCOUNTER — Other Ambulatory Visit: Payer: Self-pay | Admitting: Obstetrics and Gynecology

## 2016-06-01 ENCOUNTER — Other Ambulatory Visit: Payer: Self-pay | Admitting: Obstetrics and Gynecology

## 2016-06-03 ENCOUNTER — Telehealth: Payer: Self-pay | Admitting: *Deleted

## 2016-06-03 ENCOUNTER — Other Ambulatory Visit: Payer: Self-pay | Admitting: Family

## 2016-06-03 NOTE — Telephone Encounter (Signed)
I called pt to discuss her 2 missed appts for this week.  She stated that she could not keep appt on 1/30 due to her child arriving home form school. Today, she forgot her appt time. I informed her that it was 0740. She agreed to appt tomorrow @ [phone]. She reports good FM daily.

## 2016-06-04 ENCOUNTER — Ambulatory Visit (INDEPENDENT_AMBULATORY_CARE_PROVIDER_SITE_OTHER): Payer: Medicaid Other | Admitting: Obstetrics & Gynecology

## 2016-06-04 VITALS — BP 110/66 | HR 97 | Wt 180.6 lb

## 2016-06-04 DIAGNOSIS — O0993 Supervision of high risk pregnancy, unspecified, third trimester: Secondary | ICD-10-CM

## 2016-06-04 DIAGNOSIS — O24419 Gestational diabetes mellitus in pregnancy, unspecified control: Secondary | ICD-10-CM | POA: Diagnosis not present

## 2016-06-04 MED ORDER — GLYBURIDE 2.5 MG PO TABS: ORAL_TABLET | ORAL | 5 refills | Status: DC

## 2016-06-04 NOTE — Patient Instructions (Signed)
Return to clinic for any scheduled appointments or obstetric concerns, or go to [facility] for evaluation  

## 2016-06-04 NOTE — Progress Notes (Signed)
   PRENATAL VISIT NOTE  Subjective:  Jill Ray is a 36 y.o. G3P2002 at [redacted]w[redacted]d being seen today for ongoing prenatal care.  She is currently monitored for the following issues for this high-risk pregnancy and has Diabetes mellitus complicating pregnancy, antepartum; Supervision of high-risk pregnancy; and History of acute pancreatitis on her problem list.  Patient reports no complaints.  Contractions: Not present. Vag. Bleeding: None.  Movement: Present. Denies leaking of fluid.   The following portions of the patient's history were reviewed and updated as appropriate: allergies, current medications, past family history, past medical history, past social history, past surgical history and problem list. Problem list updated.  Objective:   Vitals:   06/04/16 1026  BP: 110/66  Pulse: 97  Weight: 180 lb 9.6 oz (81.9 kg)    Fetal Status: Fetal Heart Rate (bpm): NST Fundal Height: 34 cm Movement: Present     General:  Alert, oriented and cooperative. Patient is in no acute distress.  Skin: Skin is warm and dry. No rash noted.   Cardiovascular: Normal heart rate noted  Respiratory: Normal respiratory effort, no problems with respiration noted  Abdomen: Soft, gravid, appropriate for gestational age. Pain/Pressure: Present     Pelvic:  Cervical exam deferred        Extremities: Normal range of motion.  Edema: None  Mental Status: Normal mood and affect. Normal behavior. Normal judgment and thought content.   BS log: Fasting 3/6 values >100; PP a couple of elevated in 140s. NST performed today was reviewed and was found to be reactive.  Assessment and Plan:  Pregnancy: G3P2002 at [redacted]w[redacted]d  1. Gestational diabetes mellitus (GDM), antepartum, gestational diabetes method of control unspecified Glyburide at nighttime increased to 5 mg, will monitor BS.  Continue recommended antenatal testing and prenatal care. Next growth scan on 2/26. - Fetal nonstress test - glyBURIDE (DIABETA) 2.5 MG  tablet; Take half of one tablet at breakfast and two tablets at bedtime, approximately 12 hours apart.  Dispense: 90 tablet; Refill: 5  2. Supervision of high risk pregnancy in third trimester Preterm labor symptoms and general obstetric precautions including but not limited to vaginal bleeding, contractions, leaking of fluid and fetal movement were reviewed in detail with the patient. Please refer to After Visit Summary for other counseling recommendations.  Return in about 3 days (around 06/07/2016) for NST/AFI;  2/8 or 2/9  Ob fu and NST.   Osborne Oman, MD

## 2016-06-04 NOTE — Progress Notes (Signed)
[place] for growth scheduled 2/26.

## 2016-06-07 ENCOUNTER — Ambulatory Visit (INDEPENDENT_AMBULATORY_CARE_PROVIDER_SITE_OTHER): Payer: Medicaid Other | Admitting: Obstetrics and Gynecology

## 2016-06-07 ENCOUNTER — Ambulatory Visit: Payer: Self-pay

## 2016-06-07 VITALS — BP 107/66 | HR 99

## 2016-06-07 DIAGNOSIS — Z3689 Encounter for other specified antenatal screening: Secondary | ICD-10-CM | POA: Diagnosis not present

## 2016-06-07 DIAGNOSIS — O24419 Gestational diabetes mellitus in pregnancy, unspecified control: Secondary | ICD-10-CM

## 2016-06-07 LAB — FETAL NONSTRESS TEST

## 2016-06-07 NOTE — Progress Notes (Signed)
Pt informed that the ultrasound is considered a limited OB ultrasound and is not intended to be a complete ultrasound exam.  Patient also informed that the ultrasound is not being completed with the intent of assessing for fetal or placental anomalies or any pelvic abnormalities.  Explained that the purpose of today's ultrasound is to assess for presentation and amniotic fluid volume.  Patient acknowledges the purpose of the exam and the limitations of the study.    

## 2016-06-08 ENCOUNTER — Encounter: Payer: Self-pay | Admitting: *Deleted

## 2016-06-10 ENCOUNTER — Ambulatory Visit (INDEPENDENT_AMBULATORY_CARE_PROVIDER_SITE_OTHER): Payer: Medicaid Other | Admitting: Obstetrics and Gynecology

## 2016-06-10 VITALS — BP 107/61 | HR 105 | Wt 180.7 lb

## 2016-06-10 DIAGNOSIS — O0993 Supervision of high risk pregnancy, unspecified, third trimester: Secondary | ICD-10-CM

## 2016-06-10 DIAGNOSIS — O24419 Gestational diabetes mellitus in pregnancy, unspecified control: Secondary | ICD-10-CM | POA: Diagnosis present

## 2016-06-10 NOTE — Progress Notes (Signed)
Reactive NST Unable to tolerate 5 mg Glyburide at bedtime Pt reports BS are in goal range. Did not bring in record however. Will change back to original dose of 1.25 mg in AM and 3.75 mg at bedtime

## 2016-06-10 NOTE — Progress Notes (Signed)
Pt states she is taking Glyburide 1.25 mg in am @ breakfast and 3.75 mg @ bedtime. She states that she tried the dose that Dr. Harolyn Rutherford prescribed last week and she felt too shaky in the morning. She is still using her 1.25 mg tablets - has not picked up new Rx from 2/2.

## 2016-06-14 ENCOUNTER — Ambulatory Visit: Payer: Self-pay

## 2016-06-14 ENCOUNTER — Encounter: Payer: Self-pay | Admitting: *Deleted

## 2016-06-14 ENCOUNTER — Ambulatory Visit (INDEPENDENT_AMBULATORY_CARE_PROVIDER_SITE_OTHER): Payer: Medicaid Other | Admitting: Obstetrics and Gynecology

## 2016-06-14 DIAGNOSIS — O24419 Gestational diabetes mellitus in pregnancy, unspecified control: Secondary | ICD-10-CM | POA: Diagnosis present

## 2016-06-14 DIAGNOSIS — Z3689 Encounter for other specified antenatal screening: Secondary | ICD-10-CM | POA: Diagnosis not present

## 2016-06-14 NOTE — Progress Notes (Signed)
Reactive NST 

## 2016-06-14 NOTE — Progress Notes (Addendum)
Pt informed that the ultrasound is considered a limited OB ultrasound and is not intended to be a complete ultrasound exam.  Patient also informed that the ultrasound is not being completed with the intent of assessing for fetal or placental anomalies or any pelvic abnormalities.  Explained that the purpose of today's ultrasound is to assess for presentation and amniotic fluid volume.  Patient acknowledges the purpose of the exam and the limitations of the study.    Pt states she is missing her mother who lives in [place] very much. She requested a letter to assist in having her mother travel to the [place] for personal and newborn care following the baby's birth.  Letter given. Unable to have pt see Behavioral Health Counselor today due to staff meeting, however she may benefit from a follow up visit with her @ next prenatal visit on 2/15.

## 2016-06-14 NOTE — Progress Notes (Signed)
NST reviewed and reactive. Baseline FHR: 140, accelerations present. Deceleration absent. No contractions.   Jacquiline Doe, MD 06/14/2016 [phone]

## 2016-06-17 ENCOUNTER — Ambulatory Visit (INDEPENDENT_AMBULATORY_CARE_PROVIDER_SITE_OTHER): Payer: Medicaid Other | Admitting: Obstetrics and Gynecology

## 2016-06-17 ENCOUNTER — Ambulatory Visit: Payer: Medicaid Other | Admitting: Clinical

## 2016-06-17 VITALS — BP 127/68 | HR 109 | Wt 180.0 lb

## 2016-06-17 DIAGNOSIS — F4321 Adjustment disorder with depressed mood: Secondary | ICD-10-CM

## 2016-06-17 DIAGNOSIS — O0993 Supervision of high risk pregnancy, unspecified, third trimester: Secondary | ICD-10-CM

## 2016-06-17 DIAGNOSIS — O24419 Gestational diabetes mellitus in pregnancy, unspecified control: Secondary | ICD-10-CM

## 2016-06-17 MED ORDER — GLYBURIDE 1.25 MG PO TABS: ORAL_TABLET | ORAL | 0 refills | Status: DC

## 2016-06-17 MED ORDER — GLYBURIDE 2.5 MG PO TABS: ORAL_TABLET | ORAL | 5 refills | Status: DC

## 2016-06-17 NOTE — BH Specialist Note (Signed)
Session Start time: 2:30   End Time: 2:43 Total Time:  13 minutes Type of Service: [place]: No.   Interpreter Name & Language: n/a # [facility] Visits July 2017-June 2018: 2nd  SUBJECTIVE: Jill Ray is a 37 y.o. female  Pt. was referred by f/u for:  depression. Pt. reports the following symptoms/concerns: Pt states an increase in anxiety and depression as a result of being very busy, in the process of moving, getting things ready for baby, working with the [place] to get her mother to the [place] with her and her family; pt copes by talking to mother and sisters weekly, and continues to take daily walks. Duration of problem:  Less than 5 months Severity: moderately severe Previous treatment: none  OBJECTIVE: Mood: Appropriate & Affect: Appropriate Risk of harm to self or others: No known risk of harm to self or others Assessments administered: PHQ9:17/ GAD7:20  LIFE CONTEXT:  Family & Social: Lives with husband and three children(husband's 54yo son, their 28yo and 67yo); remainder of family in [place], many local friends School/ Work: Husband works  Self-Care: Psychologist, counselling daily; talks to mother and sister weekly  Life changes: Current pregnancy; will move from apartment to house soon What is important to pt/family (values): Family connections  GOALS ADDRESSED:  -Reduce symptoms of depression  INTERVENTIONS: Supportive and Family Systems   ASSESSMENT:  Pt currently experiencing Adjustment disorder with depressed mood.  Pt may benefit from supportive counseling today.   PLAN: 1. F/U with behavioral health clinician: As needed 2. Behavioral Health meds: none 3. Behavioral recommendations:  -Continue calling family in [place] weekly -Continue calling supportive friends, as needed, throughout the week -Continue daily walks -Continue with moving plans to new home 4. Referral: Supportive Counseling 5. From scale of 1-10, how likely are you to  follow plan: [place]: no  Depression screen [facility] 2/9 06/14/2016 06/10/2016 05/10/2016 04/28/2016 04/19/2016  Decreased Interest 2 1 2 1 1   Down, Depressed, Hopeless 2 1 1 1 1   PHQ - 2 Score 4 2 3 2 2   Altered sleeping 2 2 2 2 2   Tired, decreased energy 3 2 2 2 2   Change in appetite 1 2 2 2 1   Feeling bad or failure about yourself  2 - 0 0 0  Trouble concentrating 2 1 2 2 3   Moving slowly or fidgety/restless 3 3 2 2 3   Suicidal thoughts 0 0 0 0 0  PHQ-9 Score 17 12 13 12 13    GAD 7 : Generalized Anxiety Score 06/14/2016 06/10/2016 04/28/2016 04/19/2016  Nervous, Anxious, on Edge 3 1 1 1   Control/stop worrying 3 1 1 1   Worry too much - different things 3 1 1 1   Trouble relaxing 3 1 1 1   Restless 2 1 0 0  Easily annoyed or irritable 3 1 1 2   Afraid - awful might happen 3 1 0 1  Total GAD 7 Score 20 7 5  7

## 2016-06-17 NOTE — Progress Notes (Signed)
Pt is taking glyburide as follows:  1.25 mg @ breakfast and 3.75 mg @ bedtime.

## 2016-06-17 NOTE — Progress Notes (Signed)
Subjective:  Jill Ray is a 36 y.o. G3P2002 at [redacted]w[redacted]d being seen today for ongoing prenatal care.  She is currently monitored for the following issues for this high-risk pregnancy and has Diabetes mellitus complicating pregnancy, antepartum; Supervision of high-risk pregnancy; and History of acute pancreatitis on her problem list.  Patient reports no complaints.  Contractions: Irregular. Vag. Bleeding: None.  Movement: Present. Denies leaking of fluid.   The following portions of the patient's history were reviewed and updated as appropriate: allergies, current medications, past family history, past medical history, past social history, past surgical history and problem list. Problem list updated.  Objective:   Vitals:   06/17/16 1406  BP: 127/68  Pulse: (!) 109  Weight: 180 lb (81.6 kg)    Fetal Status: Fetal Heart Rate (bpm): NST   Movement: Present     General:  Alert, oriented and cooperative. Patient is in no acute distress.  Skin: Skin is warm and dry. No rash noted.   Cardiovascular: Normal heart rate noted  Respiratory: Normal respiratory effort, no problems with respiration noted  Abdomen: Soft, gravid, appropriate for gestational age. Pain/Pressure: Present     Pelvic:  Cervical exam deferred        Extremities: Normal range of motion.  Edema: None  Mental Status: Normal mood and affect. Normal behavior. Normal judgment and thought content.   Urinalysis:      Assessment and Plan:  Pregnancy: G3P2002 at [redacted]w[redacted]d  1. Gestational diabetes mellitus (GDM), antepartum, gestational diabetes method of control unspecified BS stable. Continue with glyburide 1.25 in AM and 3.37 mg qhs Continue with antenatal testing U/S next week - Fetal nonstress test  2. Supervision of high risk pregnancy in third trimester Needs GBS/GC/C next OB visit  Preterm labor symptoms and general obstetric precautions including but not limited to vaginal bleeding, contractions, leaking of fluid  and fetal movement were reviewed in detail with the patient. Please refer to After Visit Summary for other counseling recommendations.  Return for OB visit.   Chancy Milroy, MD

## 2016-06-21 ENCOUNTER — Ambulatory Visit: Payer: Self-pay

## 2016-06-21 ENCOUNTER — Ambulatory Visit (INDEPENDENT_AMBULATORY_CARE_PROVIDER_SITE_OTHER): Payer: Medicaid Other | Admitting: *Deleted

## 2016-06-21 VITALS — BP 113/68 | HR 97

## 2016-06-21 DIAGNOSIS — O24419 Gestational diabetes mellitus in pregnancy, unspecified control: Secondary | ICD-10-CM

## 2016-06-21 DIAGNOSIS — Z3689 Encounter for other specified antenatal screening: Secondary | ICD-10-CM | POA: Diagnosis not present

## 2016-06-21 NOTE — Progress Notes (Signed)
Pt informed that the ultrasound is considered a limited OB ultrasound and is not intended to be a complete ultrasound exam.  Patient also informed that the ultrasound is not being completed with the intent of assessing for fetal or placental anomalies or any pelvic abnormalities.  Explained that the purpose of today's ultrasound is to assess for presentation and amniotic fluid volume.  Patient acknowledges the purpose of the exam and the limitations of the study.    

## 2016-06-23 NOTE — Progress Notes (Signed)
NST 06/21/16 is reactive

## 2016-06-23 NOTE — Progress Notes (Signed)
NST 06/21/16 reactive

## 2016-06-24 ENCOUNTER — Other Ambulatory Visit: Payer: Self-pay | Admitting: Obstetrics and Gynecology

## 2016-06-24 ENCOUNTER — Other Ambulatory Visit: Payer: Self-pay | Admitting: Advanced Practice Midwife

## 2016-06-28 ENCOUNTER — Other Ambulatory Visit (HOSPITAL_COMMUNITY)
Admission: RE | Admit: 2016-06-28 | Discharge: 2016-06-28 | Disposition: A | Discharge status: Home / Self Care | Payer: Medicaid Other | Source: Ambulatory Visit | Attending: Obstetrics and Gynecology | Admitting: Obstetrics and Gynecology

## 2016-06-28 ENCOUNTER — Ambulatory Visit (HOSPITAL_COMMUNITY)
Admission: RE | Admit: 2016-06-28 | Discharge: 2016-06-28 | Disposition: A | Discharge status: Home / Self Care | Payer: Medicaid Other | Source: Ambulatory Visit | Attending: Obstetrics & Gynecology | Admitting: Obstetrics & Gynecology

## 2016-06-28 ENCOUNTER — Ambulatory Visit (INDEPENDENT_AMBULATORY_CARE_PROVIDER_SITE_OTHER): Payer: Medicaid Other | Admitting: Obstetrics and Gynecology

## 2016-06-28 VITALS — BP 105/60 | HR 100 | Wt 181.5 lb

## 2016-06-28 DIAGNOSIS — O283 Abnormal ultrasonic finding on antenatal screening of mother: Secondary | ICD-10-CM | POA: Insufficient documentation

## 2016-06-28 DIAGNOSIS — Z362 Encounter for other antenatal screening follow-up: Secondary | ICD-10-CM | POA: Insufficient documentation

## 2016-06-28 DIAGNOSIS — O24419 Gestational diabetes mellitus in pregnancy, unspecified control: Secondary | ICD-10-CM

## 2016-06-28 DIAGNOSIS — O09523 Supervision of elderly multigravida, third trimester: Secondary | ICD-10-CM | POA: Insufficient documentation

## 2016-06-28 DIAGNOSIS — Z3A37 37 weeks gestation of pregnancy: Secondary | ICD-10-CM | POA: Insufficient documentation

## 2016-06-28 DIAGNOSIS — Z113 Encounter for screening for infections with a predominantly sexual mode of transmission: Secondary | ICD-10-CM | POA: Diagnosis not present

## 2016-06-28 DIAGNOSIS — O24415 Gestational diabetes mellitus in pregnancy, controlled by oral hypoglycemic drugs: Secondary | ICD-10-CM | POA: Diagnosis present

## 2016-06-28 DIAGNOSIS — O0993 Supervision of high risk pregnancy, unspecified, third trimester: Secondary | ICD-10-CM

## 2016-06-28 LAB — POCT URINALYSIS DIP (DEVICE)
BILIRUBIN URINE: NEGATIVE
Glucose, UA: NEGATIVE mg/dL
KETONES UR: NEGATIVE mg/dL
Leukocytes, UA: NEGATIVE
Nitrite: NEGATIVE
PH: 5.5 (ref 5.0–8.0)
Protein, ur: 30 mg/dL — AB
SPECIFIC GRAVITY, URINE: 1.025 (ref 1.005–1.030)
Urobilinogen, UA: 1 mg/dL (ref 0.0–1.0)

## 2016-06-28 NOTE — Progress Notes (Signed)
[place] for growth today.  Pt requests Rx for Mometasone Furoate cream for her breasts due to itching.

## 2016-06-28 NOTE — Patient Instructions (Signed)

## 2016-06-28 NOTE — Addendum Note (Signed)
Encounter addended by: Hessie Dibble, RT on: 06/28/2016 12:00 PM<BR>    Actions taken: Imaging Exam ended

## 2016-06-28 NOTE — Progress Notes (Addendum)
   PRENATAL VISIT NOTE  Subjective:  Jill Ray is a 36 y.o. G3P2002 at [redacted]w[redacted]d being seen today for ongoing prenatal care.  She is currently monitored for the following issues for this high-risk pregnancy and has Diabetes mellitus complicating pregnancy, antepartum; Supervision of high-risk pregnancy; and History of acute pancreatitis on her problem list.  Patient reports no complaints.  Contractions: Irregular. Vag. Bleeding: None.  Movement: Present. Denies leaking of fluid.   The following portions of the patient's history were reviewed and updated as appropriate: allergies, current medications, past family history, past medical history, past social history, past surgical history and problem list. Problem list updated.  Objective:   Vitals:   06/28/16 0856  BP: 105/60  Pulse: 100  Weight: 181 lb 8 oz (82.3 kg)    Fetal Status: Fetal Heart Rate (bpm): NST   Movement: Present     General:  Alert, oriented and cooperative. Patient is in no acute distress.  Skin: Skin is warm and dry. No rash noted.   Cardiovascular: Normal heart rate noted  Respiratory: Normal respiratory effort, no problems with respiration noted  Abdomen: Soft, gravid, appropriate for gestational age. Pain/Pressure: Present     Pelvic:  Cervical exam deferred        Extremities: Normal range of motion.  Edema: None  Mental Status: Normal mood and affect. Normal behavior. Normal judgment and thought content.   Assessment and Plan:  Pregnancy: G3P2002 at [redacted]w[redacted]d  1. Gestational diabetes mellitus (GDM), antepartum, gestational diabetes method of control unspecified -sugars are 70's and 80's in the AM with glyburide 3.75 at night -sugars are mostly in the 100, 110's with 1.25 in the AM. OCcasionally when she does not follow her diet they are 140's.  -Does not have log with her -plan for induction at 39 wks - Fetal nonstress test, NST reviewed FHTS 140, reactive no decelerations.  -repeat growth today.  2.  Supervision of high risk pregnancy in third trimester Cultures today - Culture, beta strep (group b only) - Cervicovaginal ancillary only GC/CT on urine  Term labor symptoms and general obstetric precautions including but not limited to vaginal bleeding, contractions, leaking of fluid and fetal movement were reviewed in detail with the patient. Please refer to After Visit Summary for other counseling recommendations.  Return in about 1 week (around 07/05/2016) for NST only;  3/5 Ob fu and NST/AFI.   Waldemar Dickens, MD

## 2016-06-28 NOTE — Addendum Note (Signed)
Encounter addended by: Eusebio Friendly, RT, RVT, RDMS on: 06/28/2016 11:58 AM<BR>    Actions taken: Imaging Exam ended

## 2016-06-29 LAB — CERVICOVAGINAL ANCILLARY ONLY
Chlamydia: NEGATIVE
Neisseria Gonorrhea: NEGATIVE

## 2016-06-30 LAB — CULTURE, BETA STREP (GROUP B ONLY)

## 2016-07-01 ENCOUNTER — Ambulatory Visit (INDEPENDENT_AMBULATORY_CARE_PROVIDER_SITE_OTHER): Payer: Medicaid Other | Admitting: Obstetrics and Gynecology

## 2016-07-01 ENCOUNTER — Other Ambulatory Visit: Payer: Self-pay | Admitting: Obstetrics and Gynecology

## 2016-07-01 VITALS — BP 113/74 | HR 108

## 2016-07-01 DIAGNOSIS — O24419 Gestational diabetes mellitus in pregnancy, unspecified control: Secondary | ICD-10-CM

## 2016-07-01 DIAGNOSIS — O0993 Supervision of high risk pregnancy, unspecified, third trimester: Secondary | ICD-10-CM

## 2016-07-01 LAB — OB RESULTS CONSOLE GBS: GBS: POSITIVE

## 2016-07-01 NOTE — Progress Notes (Signed)
GBS re-collected.

## 2016-07-02 NOTE — Progress Notes (Signed)
3/1 NST reviewed and reactive

## 2016-07-04 LAB — CULTURE, BETA STREP (GROUP B ONLY): STREP GP B CULTURE: POSITIVE — AB

## 2016-07-05 ENCOUNTER — Encounter: Payer: Self-pay | Admitting: Obstetrics and Gynecology

## 2016-07-05 DIAGNOSIS — O99820 Streptococcus B carrier state complicating pregnancy: Secondary | ICD-10-CM | POA: Insufficient documentation

## 2016-07-06 ENCOUNTER — Telehealth (HOSPITAL_COMMUNITY): Payer: Self-pay | Admitting: *Deleted

## 2016-07-06 ENCOUNTER — Other Ambulatory Visit: Payer: Self-pay | Admitting: Obstetrics and Gynecology

## 2016-07-06 ENCOUNTER — Ambulatory Visit: Payer: Self-pay

## 2016-07-06 ENCOUNTER — Encounter: Payer: Self-pay | Admitting: Family

## 2016-07-06 ENCOUNTER — Ambulatory Visit (INDEPENDENT_AMBULATORY_CARE_PROVIDER_SITE_OTHER): Payer: Medicaid Other | Admitting: Family

## 2016-07-06 VITALS — BP 121/74 | HR 99 | Wt 182.0 lb

## 2016-07-06 DIAGNOSIS — R8271 Bacteriuria: Secondary | ICD-10-CM

## 2016-07-06 DIAGNOSIS — O99820 Streptococcus B carrier state complicating pregnancy: Secondary | ICD-10-CM

## 2016-07-06 DIAGNOSIS — Z3689 Encounter for other specified antenatal screening: Secondary | ICD-10-CM

## 2016-07-06 DIAGNOSIS — O24419 Gestational diabetes mellitus in pregnancy, unspecified control: Secondary | ICD-10-CM

## 2016-07-06 DIAGNOSIS — O0993 Supervision of high risk pregnancy, unspecified, third trimester: Secondary | ICD-10-CM

## 2016-07-06 DIAGNOSIS — O24919 Unspecified diabetes mellitus in pregnancy, unspecified trimester: Secondary | ICD-10-CM

## 2016-07-06 LAB — POCT URINALYSIS DIP (DEVICE)
Glucose, UA: NEGATIVE mg/dL
HGB URINE DIPSTICK: NEGATIVE
Ketones, ur: NEGATIVE mg/dL
Leukocytes, UA: NEGATIVE
Nitrite: POSITIVE — AB
PH: 5.5 (ref 5.0–8.0)
PROTEIN: 30 mg/dL — AB
UROBILINOGEN UA: 1 mg/dL (ref 0.0–1.0)

## 2016-07-06 MED ORDER — NITROFURANTOIN MONOHYD MACRO 100 MG PO CAPS: 100.0000 mg | ORAL_CAPSULE | Freq: Two times a day (BID) | ORAL | 1 refills | Status: DC

## 2016-07-06 NOTE — Progress Notes (Signed)
Pt informed that the ultrasound is considered a limited OB ultrasound and is not intended to be a complete ultrasound exam.  Patient also informed that the ultrasound is not being completed with the intent of assessing for fetal or placental anomalies or any pelvic abnormalities.  Explained that the purpose of today's ultrasound is to assess for presentation and amniotic fluid volume.  Patient acknowledges the purpose of the exam and the limitations of the study.    Pt reports a "change" in FM - not as vigorous. Pt felt good FM during NST today. Also she does not want to be induced @ 39 wks.

## 2016-07-06 NOTE — Progress Notes (Signed)
CBG's majority wnl   PRENATAL VISIT NOTE  Subjective:  Jill Ray is a 36 y.o. G3P2002 at [redacted]w[redacted]d being seen today for ongoing prenatal care.  She is currently monitored for the following issues for this high-risk pregnancy and has Diabetes mellitus complicating pregnancy, antepartum; Supervision of high-risk pregnancy; History of acute pancreatitis; and GBS (group B Streptococcus carrier), +RV culture, currently pregnant on her problem list.  Patient reports movement that has changed in intensity; stil feels same amount of movement.  Rash on middle breast with itching, using RX cream with improvement.  No itching or rash on other part of body.  Contractions: Irregular. Vag. Bleeding: None.  Movement: (!) Decreased. Denies leaking of fluid.   The following portions of the patient's history were reviewed and updated as appropriate: allergies, current medications, past family history, past medical history, past social history, past surgical history and problem list. Problem list updated.  Objective:   Vitals:   07/06/16 1051  BP: 121/74  Pulse: 99  Weight: 182 lb (82.6 kg)    Fetal Status: Fetal Heart Rate (bpm): NST-R   Movement: (!) Decreased     General:  Alert, oriented and cooperative. Patient is in no acute distress.  Skin: Skin is warm and dry. No rash noted.   Cardiovascular: Normal heart rate noted  Respiratory: Normal respiratory effort, no problems with respiration noted  Abdomen: Soft, gravid, appropriate for gestational age. Pain/Pressure: Present     Pelvic:  Cervical exam deferred        Extremities: Normal range of motion.  Edema: None  Mental Status: Normal mood and affect. Normal behavior. Normal judgment and thought content.   Assessment and Plan:  Pregnancy: G3P2002 at [redacted]w[redacted]d  1. Gestational diabetes mellitus (GDM), antepartum, gestational diabetes method of control unspecified - Fetal nonstress test - reactive - US OB Limited; Future  2. Supervision of  high risk pregnancy in third trimester - Return for NST on Thursday - IOL scheduled for 07/10/16  3. Diabetes mellitus complicating pregnancy, antepartum - Continue monitoring CBGs - NST Thursday and IOL at 39 wks  4. GBS (group B Streptococcus carrier), +RV culture, currently pregnant - Prophylaxis in labor  Term labor symptoms and general obstetric precautions including but not limited to vaginal bleeding, contractions, leaking of fluid and fetal movement were reviewed in detail with the patient. Please refer to After Visit Summary for other counseling recommendations.  Return in about 2 days (around 07/08/2016) for as scheduled.   Venia Carbon Michiel Cowboy, CNM

## 2016-07-06 NOTE — Patient Instructions (Signed)
Lotrimin Cream

## 2016-07-06 NOTE — Progress Notes (Signed)
During AFI pt noted to be transverse; will recheck position at AFI on Thursday.  If still transverse will plan for possible version.

## 2016-07-07 ENCOUNTER — Other Ambulatory Visit: Payer: Self-pay | Admitting: Advanced Practice Midwife

## 2016-07-08 ENCOUNTER — Ambulatory Visit (INDEPENDENT_AMBULATORY_CARE_PROVIDER_SITE_OTHER): Payer: Medicaid Other | Admitting: Obstetrics and Gynecology

## 2016-07-08 VITALS — BP 116/73 | HR 94

## 2016-07-08 DIAGNOSIS — O24419 Gestational diabetes mellitus in pregnancy, unspecified control: Secondary | ICD-10-CM

## 2016-07-08 LAB — CULTURE, OB URINE

## 2016-07-08 LAB — URINE CULTURE, OB REFLEX

## 2016-07-08 NOTE — Progress Notes (Signed)
Vtx presentation today.  IOL scheduled 3/10. Pt advised she may use Lotrimin cream on her breast per recommendation from [facility] @ visit 2 days ago.

## 2016-07-08 NOTE — Progress Notes (Signed)
NST Note Date: 07/08/2016 Gestational Age: 36/5 FHT: 135 baseline, positive accelerations, negative, deceleration, Moderate variability Toco: quiet Time: 25 minutes  A/P: rNST. contniue current plan of care  Durene Romans MD Attending Center for [facility])

## 2016-07-10 ENCOUNTER — Encounter (HOSPITAL_COMMUNITY): Payer: Self-pay

## 2016-07-10 ENCOUNTER — Inpatient Hospital Stay (HOSPITAL_COMMUNITY)
Admission: RE | Admit: 2016-07-10 | Discharge: 2016-07-13 | DRG: 775 | Disposition: A | Discharge status: Home / Self Care | Payer: Medicaid Other | Source: Ambulatory Visit | Attending: Obstetrics & Gynecology | Admitting: Obstetrics & Gynecology

## 2016-07-10 DIAGNOSIS — O99824 Streptococcus B carrier state complicating childbirth: Secondary | ICD-10-CM | POA: Diagnosis present

## 2016-07-10 DIAGNOSIS — O24919 Unspecified diabetes mellitus in pregnancy, unspecified trimester: Secondary | ICD-10-CM

## 2016-07-10 DIAGNOSIS — Z3A39 39 weeks gestation of pregnancy: Secondary | ICD-10-CM

## 2016-07-10 DIAGNOSIS — O99820 Streptococcus B carrier state complicating pregnancy: Secondary | ICD-10-CM

## 2016-07-10 DIAGNOSIS — O24419 Gestational diabetes mellitus in pregnancy, unspecified control: Secondary | ICD-10-CM | POA: Diagnosis present

## 2016-07-10 DIAGNOSIS — O0993 Supervision of high risk pregnancy, unspecified, third trimester: Secondary | ICD-10-CM

## 2016-07-10 DIAGNOSIS — O24425 Gestational diabetes mellitus in childbirth, controlled by oral hypoglycemic drugs: Secondary | ICD-10-CM | POA: Diagnosis present

## 2016-07-10 DIAGNOSIS — O24429 Gestational diabetes mellitus in childbirth, unspecified control: Secondary | ICD-10-CM | POA: Diagnosis not present

## 2016-07-10 HISTORY — DX: Gestational diabetes mellitus in pregnancy, unspecified control: O24.419

## 2016-07-10 LAB — TYPE AND SCREEN
ABO/RH(D): O POS
ANTIBODY SCREEN: NEGATIVE

## 2016-07-10 LAB — CBC
HCT: 34.4 % — ABNORMAL LOW (ref 36.0–46.0)
Hemoglobin: 11.9 g/dL — ABNORMAL LOW (ref 12.0–15.0)
MCH: 32.2 pg (ref 26.0–34.0)
MCHC: 34.6 g/dL (ref 30.0–36.0)
MCV: 93.2 fL (ref 78.0–100.0)
PLATELETS: 157 10*3/uL (ref 150–400)
RBC: 3.69 MIL/uL — ABNORMAL LOW (ref 3.87–5.11)
RDW: 13.8 % (ref 11.5–15.5)
WBC: 5.4 10*3/uL (ref 4.0–10.5)

## 2016-07-10 LAB — GLUCOSE, CAPILLARY
GLUCOSE-CAPILLARY: 102 mg/dL — AB (ref 65–99)
Glucose-Capillary: 107 mg/dL — ABNORMAL HIGH (ref 65–99)
Glucose-Capillary: 68 mg/dL (ref 65–99)
Glucose-Capillary: 121 mg/dL — ABNORMAL HIGH (ref 65–99)

## 2016-07-10 LAB — RPR: RPR: NONREACTIVE

## 2016-07-10 MED ORDER — OXYTOCIN 40 UNITS IN LACTATED RINGERS INFUSION - SIMPLE MED: 2.5000 [IU]/h | INTRAVENOUS | Status: DC

## 2016-07-10 MED ORDER — OXYCODONE-ACETAMINOPHEN 5-325 MG PO TABS: 2.0000 | ORAL_TABLET | ORAL | Status: DC | PRN

## 2016-07-10 MED ORDER — TERBUTALINE SULFATE 1 MG/ML IJ SOLN
0.2500 mg | Freq: Once | INTRAMUSCULAR | Status: DC | PRN
  Filled 2016-07-10: qty 1

## 2016-07-10 MED ORDER — ACETAMINOPHEN 325 MG PO TABS: 650.0000 mg | ORAL_TABLET | ORAL | Status: DC | PRN

## 2016-07-10 MED ORDER — OXYCODONE-ACETAMINOPHEN 5-325 MG PO TABS: 1.0000 | ORAL_TABLET | ORAL | Status: DC | PRN

## 2016-07-10 MED ORDER — LACTATED RINGERS IV SOLN: 500.0000 mL | INTRAVENOUS | Status: DC | PRN

## 2016-07-10 MED ORDER — LIDOCAINE HCL (PF) 1 % IJ SOLN
30.0000 mL | INTRAMUSCULAR | Status: DC | PRN
  Filled 2016-07-10: qty 30

## 2016-07-10 MED ORDER — LACTATED RINGERS IV SOLN
INTRAVENOUS | Status: DC
  Administered 2016-07-10 (×2): via INTRAVENOUS

## 2016-07-10 MED ORDER — FENTANYL CITRATE (PF) 100 MCG/2ML IJ SOLN
100.0000 ug | INTRAMUSCULAR | Status: DC | PRN
  Administered 2016-07-10 (×2): 100 ug via INTRAVENOUS
  Filled 2016-07-10 (×2): qty 2

## 2016-07-10 MED ORDER — ONDANSETRON HCL 4 MG/2ML IJ SOLN: 4.0000 mg | Freq: Four times a day (QID) | INTRAMUSCULAR | Status: DC | PRN

## 2016-07-10 MED ORDER — PENICILLIN G POTASSIUM 5000000 UNITS IJ SOLR
5.0000 10*6.[IU] | Freq: Once | INTRAVENOUS | Status: AC
  Administered 2016-07-10: 5 10*6.[IU] via INTRAVENOUS
  Filled 2016-07-10: qty 5

## 2016-07-10 MED ORDER — OXYTOCIN BOLUS FROM INFUSION
500.0000 mL | Freq: Once | INTRAVENOUS | Status: AC
  Administered 2016-07-11: 500 mL/h via INTRAVENOUS

## 2016-07-10 MED ORDER — OXYTOCIN 40 UNITS IN LACTATED RINGERS INFUSION - SIMPLE MED
1.0000 m[IU]/min | INTRAVENOUS | Status: DC
  Administered 2016-07-10: 2 m[IU]/min via INTRAVENOUS
  Administered 2016-07-10: 12 m[IU]/min via INTRAVENOUS
  Filled 2016-07-10: qty 1000

## 2016-07-10 MED ORDER — PENICILLIN G POT IN DEXTROSE 60000 UNIT/ML IV SOLN
3.0000 10*6.[IU] | INTRAVENOUS | Status: DC
  Administered 2016-07-10 (×3): 3 10*6.[IU] via INTRAVENOUS
  Filled 2016-07-10 (×7): qty 50

## 2016-07-10 MED ORDER — MISOPROSTOL 25 MCG QUARTER TABLET
25.0000 ug | ORAL_TABLET | ORAL | Status: DC | PRN
  Administered 2016-07-10: 25 ug via VAGINAL
  Filled 2016-07-10: qty 0.25
  Filled 2016-07-10: qty 1

## 2016-07-10 MED ORDER — TERBUTALINE SULFATE 1 MG/ML IJ SOLN
INTRAMUSCULAR | Status: AC
  Administered 2016-07-10: 0.25 mg
  Filled 2016-07-10: qty 1

## 2016-07-10 MED ORDER — SOD CITRATE-CITRIC ACID 500-334 MG/5ML PO SOLN: 30.0000 mL | ORAL | Status: DC | PRN

## 2016-07-10 NOTE — Progress Notes (Signed)
Patient ID: Jill Ray, female   DOB: Oct 30, 1980, 36 y.o.   MRN: [id number]  S: Patient seen & examined for progress of labor. Patient comfortable.     O:  Vitals:   07/10/16 2100 07/10/16 2130 07/10/16 2230 07/10/16 2300  BP: 122/72 120/68 118/68 124/76  Pulse: 96 98 98 92  Resp: 18 18 18    Temp:    98.2 F (36.8 C)  TempSrc:    Oral  SpO2:      Weight:      Height:        Dilation: 5 Effacement (%): 80 Cervical Position: Middle Station: -3 Presentation: Vertex Exam by:: Yusuf Yu MD  AROM performed, clear fluid returned. Patient and baby tolerated procedure well.   FHT: 130 bpm, mod var, +accels, no decels TOCO: q3-10min   A/P: AROM performed, clear fluid Continue pitocin Continue expectant management Anticipate SVD

## 2016-07-10 NOTE — Progress Notes (Signed)
Patient ID: Jill Ray, female   DOB: 23-Jul-1980, 36 y.o.   MRN: [id number]  S: Patient seen & examined for progress of labor. Patient comfortable but feeling contractions frequently.    O:  Vitals:   07/10/16 0925 07/10/16 1317  BP: 124/81   Pulse: (!) 110   Resp: 16 18  Temp: 97.9 F (36.6 C) 98.1 F (36.7 C)  TempSrc: Oral Oral  SpO2: 100%   Weight: 185 lb (83.9 kg)     Dilation: Fingertip Effacement (%): Thick Cervical Position: Posterior Station: -3 Presentation: Vertex Exam by:: dr Vanetta Shawl   FHT: 140 bpm, mod var, +accels, no decels TOCO: q3-51min  BSUS performed again due to fetal head is not low in pelvis, still confirmed vertex/cephalic presentation.  A/P: Contracting too frequently for cytotec, switching to pitocin Continue expectant management Anticipate SVD

## 2016-07-10 NOTE — Anesthesia Pain Management Evaluation Note (Signed)
  CRNA Pain Management Visit Note  Patient: Jill Ray, 36 y.o., female  "Hello I am a member of the anesthesia team at [facility]. We have an anesthesia team available at all times to provide care throughout the hospital, including epidural management and anesthesia for C-section. I don't know your plan for the delivery whether it a natural birth, water birth, IV sedation, nitrous supplementation, doula or epidural, but we want to meet your pain goals."   1.Was your pain managed to your expectations on prior hospitalizations?   Yes   2.What is your expectation for pain management during this hospitalization?     IV pain meds  3.How can we help you reach that goal? Iv pain rx  Record the patient's initial score and the patient's pain goal.   Pain: 5  Pain Goal: 8 [facility] wants you to be able to say your pain was always managed very well.  Jill Ray 07/10/2016

## 2016-07-10 NOTE — Progress Notes (Signed)
Comfortable, feeling contractions, walking in the room  BP 124/81 (BP Location: Left Arm)   Pulse (!) 110   Temp 98.1 F (36.7 C) (Oral)   Resp 18   Wt 185 lb (83.9 kg)   LMP 10/11/2015   SpO2 100%   BMI 33.84 kg/m  Fetal monitoring: baseline 140, mod variab, + acels, no decels  A tolerate induction well  P: continue IOL. Expect vaginal delivery  Allene Pyo do Lorra Hals MD PGY1

## 2016-07-10 NOTE — Progress Notes (Signed)
Patient ID: Jill Ray, female   DOB: Nov 26, 1980, 36 y.o.   MRN: [id number]  S: Patient seen & examined for progress of labor. Patient comfortable feeling contractions but states are not too strong.    O:  Vitals:   07/10/16 1758 07/10/16 1800 07/10/16 1825 07/10/16 1930  BP:  115/66  120/68  Pulse:  (!) 101  80  Resp: 18   18  Temp: 98.3 F (36.8 C)   98.2 F (36.8 C)  TempSrc: Oral   Oral  SpO2:      Weight:   185 lb (83.9 kg)   Height:   5\' 4"  (1.626 m)     2/80/high but not ballotable  FHT: 130 bpm, mod var, +accels, no decels TOCO: q2-48min  Foley bulb inserted with ease, insufflated to 60cc with saline, confirmed placement. Patient tolerated procedure well.  A/P: Continue pitocin FB in place Continue expectant management Anticipate SVD

## 2016-07-10 NOTE — H&P (Signed)
LABOR AND DELIVERY ADMISSION HISTORY AND PHYSICAL  Jill Ray is a 36 y.o. female presenting for IOL for GDM A2. [id number], GBS positive, Fetus is female, desires to breastfeed and bottlefeed and desires oral pill contraception after delivery. Feels comfortable, had 2 vaginal deliveries in the past with no complications (7 and 4 years ago), full term. No comorbidities reported other than gestational diabetes. GDM treated with glyburide. Denies headaches, SOB, N/V, leg swelling, RUQ pain, chest pain, vaginal bleeding or fluid leakage. Feeling contractions, irregular, eventually.  Feeling baby moving. EFW @37w  was around 2770g. [place] report stating fetus started the exam cephalic and ended transverse.   OB History    Gravida Para Term Preterm AB Living   3 2 2  0 0 2   SAB TAB Ectopic Multiple Live Births   0 0 0 0 2     Past Medical History:  Diagnosis Date  . Anemia   . Asthma   . Gallstone pancreatitis 08/08/2012   Archie Endo 08/08/2012  . Gallstone pancreatitis 08/14/2012  . Umbilical hernia    hernia (reducible umbilical hernia) is present. Archie Endo 08/08/2012  . Urinary tract infection    GBS in urine current preg   Past Surgical History:  Procedure Laterality Date  . CHOLECYSTECTOMY N/A 08/11/2012   Procedure: LAPAROSCOPIC CHOLECYSTECTOMY;  Surgeon: Rolm Bookbinder, MD;  Location: [facility];  Service: General;  Laterality: N/A;  . ERCP N/A 08/13/2012   Procedure: ENDOSCOPIC RETROGRADE CHOLANGIOPANCREATOGRAPHY (ERCP);  Surgeon: Jeryl Columbia, MD;  Location: [facility];  Service: Endoscopy;  Laterality: N/A;  . NO PAST SURGERIES    . SPHINCTEROTOMY  08/13/2012   Procedure: SPHINCTEROTOMY;  Surgeon: Jeryl Columbia, MD;  Location: [facility] OR;  Service: Endoscopy;;   Family History: family history includes Asthma in her maternal grandfather; Hypotension in her mother. Social History:  reports that she has never smoked. She has never used smokeless tobacco. She reports that she does not drink alcohol or use  drugs.     Maternal Diabetes: Yes:  Diabetes Type:  Insulin/Medication controlled Genetic Screening: Declined Maternal Ultrasounds/Referrals: Normal Fetal Ultrasounds or other Referrals:  None Maternal Substance Abuse:  No Significant Maternal Medications:  Meds include: Other:  glyburide Significant Maternal Lab Results:  Lab values include: Group B Strep positive Other Comments:  None  Review of Systems  Constitutional: Negative for fever.  Eyes: Negative for blurred vision and double vision.  Respiratory: Negative for shortness of breath.   Cardiovascular: Negative for chest pain and leg swelling.  Gastrointestinal: Negative for abdominal pain, nausea and vomiting.  Genitourinary: Negative for dysuria.  Neurological: Negative for dizziness and headaches.   Maternal Medical History:  Reason for admission: Nausea.    Dilation: Fingertip Effacement (%): Thick Station: -3 Exam by:: heather mitchell rn Blood pressure 124/81, pulse (!) 110, temperature 97.9 F (36.6 C), temperature source Oral, resp. rate 16, weight 185 lb (83.9 kg), last menstrual period 10/11/2015, SpO2 100 %.  Unable to feel vertex on cervical check. Bedside US performed: fetus is transverse  Exam Physical Exam  Constitutional: She is oriented to person, place, and time. She appears well-developed and well-nourished.  HENT:  Head: Normocephalic and atraumatic.  Eyes: Conjunctivae and EOM are normal. Pupils are equal, round, and reactive to light.  Cardiovascular: Intact distal pulses.   Respiratory: Effort normal. No respiratory distress. She has no wheezes.  GI: Soft. She exhibits no distension. There is no tenderness. There is no rebound and no guarding.  Gravid. Leopold maneuvers indicate fetus  head to be transverse/oblique, pointing towards mother's left side (4-5 o'clock)  Neurological: She is alert and oriented to person, place, and time.  Skin: Skin is warm and dry. No rash noted. No pallor.     Prenatal labs: ABO, Rh: --/--/O POS (03/10 0930) Antibody: NEG (03/10 0930) Rubella: Immune (10/16 0000) RPR: NON REAC (12/18 1127)  HBsAg: Negative (10/16 0000)  HIV: NONREACTIVE (12/18 1127)  GBS:   Positive  Assessment/Plan:  #LABOR: IOL with cytotec and possible FB #ID: GBS Pos - PCN #FWB: Cat I #MOC: OCPs #MOF: breast #CIRC: N/A - girl #GDMA2: CBG q4h #Malpresentation: External cephalic version (Please see separate note)   Allene Pyo do Lorra Hals MD PGY1 07/10/2016, 11:12 AM   OB FELLOW HISTORY AND PHYSICAL ATTESTATION  I have seen and examined this patient; I agree with above documentation in the resident's note.    Katherine Basset, DO [place] Fellow 07/10/2016

## 2016-07-10 NOTE — Progress Notes (Signed)
Patient ID: Jill Ray, female   DOB: 12-20-1980, 36 y.o.   MRN: [id number]  External Cephalic Version  Patient was found to be oblique/transverse presentation with head to mother's left and back superior. After informed verbal consent, Terbutaline 0.25 mg SQ given, ECV was attempted under Ultrasound guidance.  Forward summersault to mother's right, three attempts, successful. FHR was reactive before and after the procedure.   Pt. Tolerated the procedure well. Dr. Roselie Awkward present for entire procedure.  Zenda Alpers, DO  OB Fellow Center for [facility], [facility]

## 2016-07-11 ENCOUNTER — Inpatient Hospital Stay (HOSPITAL_COMMUNITY): Payer: Medicaid Other | Admitting: Anesthesiology

## 2016-07-11 ENCOUNTER — Encounter (HOSPITAL_COMMUNITY): Payer: Self-pay

## 2016-07-11 DIAGNOSIS — O99824 Streptococcus B carrier state complicating childbirth: Secondary | ICD-10-CM

## 2016-07-11 DIAGNOSIS — O24429 Gestational diabetes mellitus in childbirth, unspecified control: Secondary | ICD-10-CM

## 2016-07-11 DIAGNOSIS — Z3A39 39 weeks gestation of pregnancy: Secondary | ICD-10-CM

## 2016-07-11 LAB — GLUCOSE, CAPILLARY: GLUCOSE-CAPILLARY: 111 mg/dL — AB (ref 65–99)

## 2016-07-11 MED ORDER — DIPHENHYDRAMINE HCL 50 MG/ML IJ SOLN: 12.5000 mg | INTRAMUSCULAR | Status: DC | PRN

## 2016-07-11 MED ORDER — WITCH HAZEL-GLYCERIN EX PADS: 1.0000 "application " | MEDICATED_PAD | CUTANEOUS | Status: DC | PRN

## 2016-07-11 MED ORDER — OXYCODONE HCL 5 MG PO TABS: 5.0000 mg | ORAL_TABLET | ORAL | Status: DC | PRN

## 2016-07-11 MED ORDER — TETANUS-DIPHTH-ACELL PERTUSSIS 5-2.5-18.5 LF-MCG/0.5 IM SUSP
0.5000 mL | Freq: Once | INTRAMUSCULAR | Status: DC
Start: 2016-07-12 — End: 2016-07-13

## 2016-07-11 MED ORDER — COCONUT OIL OIL: 1.0000 "application " | TOPICAL_OIL | Status: DC | PRN

## 2016-07-11 MED ORDER — PHENYLEPHRINE 40 MCG/ML (10ML) SYRINGE FOR IV PUSH (FOR BLOOD PRESSURE SUPPORT)
80.0000 ug | PREFILLED_SYRINGE | INTRAVENOUS | Status: DC | PRN
  Filled 2016-07-11: qty 5

## 2016-07-11 MED ORDER — FENTANYL 2.5 MCG/ML BUPIVACAINE 1/10 % EPIDURAL INFUSION (WH - ANES)
14.0000 mL/h | INTRAMUSCULAR | Status: DC | PRN
  Administered 2016-07-11: 14 mL/h via EPIDURAL

## 2016-07-11 MED ORDER — SIMETHICONE 80 MG PO CHEW: 80.0000 mg | CHEWABLE_TABLET | ORAL | Status: DC | PRN

## 2016-07-11 MED ORDER — DIPHENHYDRAMINE HCL 25 MG PO CAPS: 25.0000 mg | ORAL_CAPSULE | Freq: Four times a day (QID) | ORAL | Status: DC | PRN

## 2016-07-11 MED ORDER — ONDANSETRON HCL 4 MG/2ML IJ SOLN: 4.0000 mg | INTRAMUSCULAR | Status: DC | PRN

## 2016-07-11 MED ORDER — ONDANSETRON HCL 4 MG PO TABS: 4.0000 mg | ORAL_TABLET | ORAL | Status: DC | PRN

## 2016-07-11 MED ORDER — IBUPROFEN 600 MG PO TABS
600.0000 mg | ORAL_TABLET | Freq: Four times a day (QID) | ORAL | Status: DC
  Administered 2016-07-11 – 2016-07-13 (×9): 600 mg via ORAL
  Filled 2016-07-11 (×9): qty 1

## 2016-07-11 MED ORDER — PRENATAL MULTIVITAMIN CH
1.0000 | ORAL_TABLET | Freq: Every day | ORAL | Status: DC
  Administered 2016-07-11 – 2016-07-13 (×3): 1 via ORAL
  Filled 2016-07-11 (×3): qty 1

## 2016-07-11 MED ORDER — ACETAMINOPHEN 325 MG PO TABS: 650.0000 mg | ORAL_TABLET | ORAL | Status: DC | PRN

## 2016-07-11 MED ORDER — EPHEDRINE 5 MG/ML INJ
10.0000 mg | INTRAVENOUS | Status: DC | PRN
  Filled 2016-07-11: qty 4

## 2016-07-11 MED ORDER — SENNOSIDES-DOCUSATE SODIUM 8.6-50 MG PO TABS
2.0000 | ORAL_TABLET | ORAL | Status: DC
  Administered 2016-07-12 (×2): 2 via ORAL
  Filled 2016-07-11 (×2): qty 2

## 2016-07-11 MED ORDER — PHENYLEPHRINE 40 MCG/ML (10ML) SYRINGE FOR IV PUSH (FOR BLOOD PRESSURE SUPPORT)
PREFILLED_SYRINGE | INTRAVENOUS | Status: AC
  Filled 2016-07-11: qty 20

## 2016-07-11 MED ORDER — DIBUCAINE 1 % RE OINT: 1.0000 "application " | TOPICAL_OINTMENT | RECTAL | Status: DC | PRN

## 2016-07-11 MED ORDER — BENZOCAINE-MENTHOL 20-0.5 % EX AERO: 1.0000 "application " | INHALATION_SPRAY | CUTANEOUS | Status: DC | PRN

## 2016-07-11 MED ORDER — FENTANYL 2.5 MCG/ML BUPIVACAINE 1/10 % EPIDURAL INFUSION (WH - ANES)
INTRAMUSCULAR | Status: AC
  Filled 2016-07-11: qty 100

## 2016-07-11 MED ORDER — LACTATED RINGERS IV SOLN
500.0000 mL | Freq: Once | INTRAVENOUS | Status: AC
  Administered 2016-07-11: 500 mL via INTRAVENOUS

## 2016-07-11 MED ORDER — ZOLPIDEM TARTRATE 5 MG PO TABS: 5.0000 mg | ORAL_TABLET | Freq: Every evening | ORAL | Status: DC | PRN

## 2016-07-11 MED ORDER — LIDOCAINE HCL (PF) 1 % IJ SOLN
INTRAMUSCULAR | Status: DC | PRN
  Administered 2016-07-11 (×2): 5 mL via EPIDURAL

## 2016-07-11 NOTE — Progress Notes (Signed)
Time Change

## 2016-07-11 NOTE — Anesthesia Procedure Notes (Signed)
Epidural Patient location during procedure: OB Start time: 07/11/2016 12:47 AM End time: 07/11/2016 12:51 AM  Staffing Anesthesiologist: Lyn Hollingshead Performed: anesthesiologist   Preanesthetic Checklist Completed: patient identified, surgical consent, pre-op evaluation, timeout performed, IV checked, risks and benefits discussed and monitors and equipment checked  Epidural Patient position: sitting Prep: site prepped and draped and DuraPrep Patient monitoring: continuous pulse ox and blood pressure Approach: midline Location: L3-L4 Injection technique: LOR air  Needle:  Needle type: Tuohy  Needle gauge: 17 G Needle length: 9 cm and 9 Needle insertion depth: 5 cm cm Catheter type: closed end flexible Catheter size: 19 Gauge Catheter at skin depth: 10 cm Test dose: negative and Other  Assessment Sensory level: T9 Events: blood not aspirated, injection not painful, no injection resistance, negative IV test and no paresthesia  Additional Notes Reason for block:procedure for pain

## 2016-07-11 NOTE — Lactation Note (Signed)
This note was copied from a baby's chart. Lactation Consultation Note  Patient Name: Jill Ray KCLEX'N Date: 07/11/2016 Reason for consult: Initial assessment;Infant < 6lbs Assisted Mom with positioning and latching baby. Took several attempts and some suck training, baby tongue thrusting and not opening mouth wide. Mom's nipples are erect but have short shafts. Once baby latched she demonstrated good suckling bursts. Mom plans to BR/BO. LC discussed risk of early supplementation to BF success unless medically necessary. LC encouraged Mom if she does supplement with feeding now to BF 1st both breasts before giving supplement to encourage milk production, prevent engorgement and protect milk supply. Advised Mom we could also help her with ways to supplement without using a bottle if desired. Advised baby should be at breast 8-12 times in 24 hours and with feeding ques. Lactation brochure left for review, advised of OP services and support group. Encouraged to call for assist as needed.   Maternal Data Formula Feeding for Exclusion: No Has patient been taught Hand Expression?: Yes Does the patient have breastfeeding experience prior to this delivery?: Yes  Feeding Feeding Type: Breast Fed Length of feed: 30 min  LATCH Score/Interventions Latch: Grasps breast easily, tongue down, lips flanged, rhythmical sucking. (Mom reports much better than earlier feeding) Intervention(s): Adjust position;Assist with latch;Breast massage;Breast compression  Audible Swallowing: A few with stimulation  Type of Nipple: Everted at rest and after stimulation  Comfort (Breast/Nipple): Soft / non-tender     Hold (Positioning): No assistance needed to correctly position infant at breast. Intervention(s): Breastfeeding basics reviewed;Support Pillows;Position options;Skin to skin  LATCH Score: 9  Lactation Tools Discussed/Used     Consult Status Consult Status: Follow-up Date:  07/12/16 Follow-up type: In-patient    Katrine Coho 07/11/2016, 12:50 PM

## 2016-07-11 NOTE — Anesthesia Preprocedure Evaluation (Signed)
Anesthesia Evaluation  Patient identified by MRN, date of birth, ID band Patient awake    Reviewed: Allergy & Precautions, H&P , NPO status , Patient's Chart, lab work & pertinent test results  Airway Mallampati: I  TM Distance: >3 FB Neck ROM: full    Dental no notable dental hx.    Pulmonary    Pulmonary exam normal        Cardiovascular negative cardio ROS Normal cardiovascular exam     Neuro/Psych negative neurological ROS  negative psych ROS   GI/Hepatic negative GI ROS, Neg liver ROS,   Endo/Other  diabetes, Gestational, Oral Hypoglycemic Agents  Renal/GU negative Renal ROS     Musculoskeletal   Abdominal (+) + obese,   Peds  Hematology   Anesthesia Other Findings   Reproductive/Obstetrics (+) Pregnancy                             Anesthesia Physical Anesthesia Plan  ASA: II  Anesthesia Plan: Epidural   Post-op Pain Management:    Induction:   Airway Management Planned:   Additional Equipment:   Intra-op Plan:   Post-operative Plan:   Informed Consent: I have reviewed the patients History and Physical, chart, labs and discussed the procedure including the risks, benefits and alternatives for the proposed anesthesia with the patient or authorized representative who has indicated his/her understanding and acceptance.     Plan Discussed with:   Anesthesia Plan Comments:         Anesthesia Quick Evaluation

## 2016-07-11 NOTE — Anesthesia Postprocedure Evaluation (Signed)
Anesthesia Post Note  Patient: Jill Ray  Procedure(s) Performed: * No procedures listed *  Patient location during evaluation: Mother Baby Anesthesia Type: Epidural Level of consciousness: awake, awake and alert, oriented and patient cooperative Pain management: pain level controlled Vital Signs Assessment: post-procedure vital signs reviewed and stable Respiratory status: spontaneous breathing, nonlabored ventilation and respiratory function stable Cardiovascular status: stable Postop Assessment: no headache, no backache, patient able to bend at knees and no signs of nausea or vomiting Anesthetic complications: no        Last Vitals:  Vitals:   07/11/16 0600 07/11/16 0715  BP: 119/62 127/72  Pulse: (!) 101 (!) 106  Resp: 18 16  Temp: 36.8 C 36.7 C    Last Pain:  Vitals:   07/11/16 0715  TempSrc: Oral  PainSc:    Pain Goal: Patients Stated Pain Goal: 3 (07/11/16 0130)               Zevin Nevares L

## 2016-07-12 NOTE — Lactation Note (Signed)
This note was copied from a baby's chart. Lactation Consultation Note: Experienced BF mom reports baby has just finished feeding for 30 min. Reports no pain with latch except for uterine cramping.  Reports breasts are feeling fuller this morning. Asking about when to give bottle- encouraged to wait a few Tatum Massman. Mom states she plans to breast and bottle feed this baby like she did her others. Reviewed engorgement prevention and treatment. Manual pump given with instructions for setup, use and cleaning of pump pieces. No questions at present. To call prn  Patient Name: Jill Ray TRZNB'V Date: 07/12/2016 Reason for consult: Follow-up assessment   Maternal Data Formula Feeding for Exclusion: Yes Reason for exclusion: Mother's choice to formula and breast feed on admission Has patient been taught Hand Expression?: Yes Does the patient have breastfeeding experience prior to this delivery?: Yes  Feeding Feeding Type: Breast Fed  LATCH Score/Interventions Latch: Repeated attempts needed to sustain latch, nipple held in mouth throughout feeding, stimulation needed to elicit sucking reflex.  Audible Swallowing: A few with stimulation  Type of Nipple: Everted at rest and after stimulation  Comfort (Breast/Nipple): Soft / non-tender     Hold (Positioning): Assistance needed to correctly position infant at breast and maintain latch.  LATCH Score: 7  Lactation Tools Discussed/Used     Consult Status Consult Status: Complete    Truddie Crumble 07/12/2016, 10:20 AM

## 2016-07-12 NOTE — Progress Notes (Signed)
Post Partum Day #1 Subjective: no complaints, up ad lib, voiding and tolerating PO  Objective: Blood pressure 136/87, pulse (!) 105, temperature 98.6 F (37 C), temperature source Oral, resp. rate 18, height 5\' 4"  (1.626 m), weight 83.9 kg (185 lb), last menstrual period 10/11/2015, SpO2 99 %, unknown if currently breastfeeding.  Physical Exam:  General: alert Lochia: appropriate Uterine Fundus: firm and NT at U-2 DVT Evaluation: No evidence of DVT seen on physical exam.   Recent Labs  07/10/16 0930  HGB 11.9*  HCT 34.4*    Assessment/Plan: Plan for discharge tomorrow as she does not have a ride home today. Plans for POPs   LOS: 2 days   Emily Filbert 07/12/2016, 6:48 AM

## 2016-07-13 MED ORDER — IBUPROFEN 600 MG PO TABS: 600.0000 mg | ORAL_TABLET | Freq: Four times a day (QID) | ORAL | 0 refills | Status: DC

## 2016-07-13 MED ORDER — NORETHINDRONE 0.35 MG PO TABS: 1.0000 | ORAL_TABLET | Freq: Every day | ORAL | 11 refills | Status: DC

## 2016-07-13 NOTE — Discharge Instructions (Signed)

## 2016-07-13 NOTE — Discharge Summary (Signed)
Obstetric Discharge Summary Reason for Admission: [redacted] weeks EGA, IOL for GDMA2, cytotec used Prenatal Procedures: ultrasound and NST Intrapartum Procedures: spontaneous vaginal delivery Postpartum Procedures: none Complications-Operative and Postpartum: 2nd degree perineal laceration Hemoglobin  Date Value Ref Range Status  07/10/2016 11.9 (L) 12.0 - 15.0 g/dL Final   HCT  Date Value Ref Range Status  07/10/2016 34.4 (L) 36.0 - 46.0 % Final    Physical Exam:  General: alert Lochia: appropriate Uterine Fundus: firm at U-2 DVT Evaluation: No evidence of DVT seen on physical exam.  Discharge Diagnoses: Term Pregnancy-delivered  Discharge Information: Date: 07/13/2016 Activity: pelvic rest Diet: routine Medications: PNV, Ibuprofen and micronor Condition: stable Instructions: refer to practice specific booklet Discharge to: home  Post partum sugars were good with no meds. Follow-up Information    Mora Bellman, MD. Schedule an appointment as soon as possible for a visit in 6 week(s).   Specialty:  Obstetrics and Gynecology Why:  Tell them you need to be rechecked for diabetes. Contact information: [address] [place] [address] [phone]           Newborn Data: Live born female  Birth Weight: 5 lb 11.2 oz (2586 g) APGAR: 9, 9  Home with mother.  Emily Filbert 07/13/2016, 8:41 AM

## 2016-07-13 NOTE — Lactation Note (Signed)
This note was copied from a baby's chart. Lactation Consultation Note  Patient Name: Jill Ray MEQAS'T Date: 07/13/2016 Reason for consult: Follow-up assessment (for D/C today ) 5% weight loss  LC reviewed Doc flow sheets  Baby is 57 hours and the baby  Has been consistent with breast feeding and mom also is supplementing.  LC reviewed engorgement prevention and tx , already has a hand pump. LC stressed the importance of feeding with feeding cues and at least every 3 hours . Mother informed of post-discharge support and given phone number to the lactation department, including services for phone call assistance; out-patient appointments; and breastfeeding support group. List of other breastfeeding resources in the community given in the handout. Encouraged mother to call for problems or concerns related to breastfeeding.   Maternal Data    Feeding Feeding Type: Breast Fed Length of feed: 10 min  LATCH Score/Interventions                Intervention(s): Breastfeeding basics reviewed     Lactation Tools Discussed/Used Tools: Pump (already has a hand pump +) Breast pump type: Manual   Consult Status Consult Status: Complete Date: 07/13/16    Myer Haff 07/13/2016, 12:28 PM

## 2016-08-16 ENCOUNTER — Encounter: Payer: Self-pay | Admitting: Certified Nurse Midwife

## 2016-08-16 ENCOUNTER — Ambulatory Visit (INDEPENDENT_AMBULATORY_CARE_PROVIDER_SITE_OTHER): Payer: Medicaid Other | Admitting: Certified Nurse Midwife

## 2016-08-16 VITALS — BP 111/77 | HR 88 | Wt 170.4 lb

## 2016-08-16 DIAGNOSIS — Z789 Other specified health status: Secondary | ICD-10-CM

## 2016-08-16 DIAGNOSIS — Z30011 Encounter for initial prescription of contraceptive pills: Secondary | ICD-10-CM

## 2016-08-16 MED ORDER — NORETHINDRONE 0.35 MG PO TABS: 1.0000 | ORAL_TABLET | Freq: Every day | ORAL | 6 refills | Status: DC

## 2016-08-16 MED ORDER — IBUPROFEN 600 MG PO TABS: 600.0000 mg | ORAL_TABLET | Freq: Four times a day (QID) | ORAL | 0 refills | Status: DC

## 2016-08-16 MED ORDER — PRENATAL VITAMINS 28-0.8 MG PO TABS: 1.0000 | ORAL_TABLET | Freq: Every day | ORAL | 12 refills | Status: DC

## 2016-08-16 NOTE — Progress Notes (Addendum)
Subjective:   Not fasting today, so will schedule 2 hour gtt another day due to GDM.   Jill Ray is a 36 y.o. female who presents for a postpartum visit. She is 5 weeks postpartum following a spontaneous vaginal delivery. I have fully reviewed the prenatal and intrapartum course. The delivery was at 42 gestational weeks. Outcome: spontaneous vaginal delivery. Anesthesia: regional. Postpartum course has been uncomplicated. Baby's course has been uncomplicated. Baby is feeding by both breast and bottle - Similac. Bleeding no bleeding. Bowel function is normal. Bladder function is normal. Patient is not sexually active. Contraception method is oral progesterone-only contraceptive. Postpartum depression screening: negative.  The following portions of the patient's history were reviewed and updated as appropriate: allergies, current medications, past family history, past medical history, past social history, past surgical history and problem list.  Review of Systems Pertinent items are noted in HPI.   Objective:    BP 111/77   Pulse 88   Wt 170 lb 6.4 oz (77.3 kg)   Breastfeeding? Yes   BMI 29.25 kg/m   General:  alert, cooperative and no distress   Breasts:    Lungs: nml effort and rate  Heart:  nml rate  Abdomen:    Vulva:  normal  Vagina: normal vagina and laceration well healed  Cervix:    Corpus:   Adnexa:    Rectal Exam:         Assessment:      Normal postpartum exam.  Pap smear not done at today's visit.  Oral contraception management Plan:      1. Contraception: POP : discussed correct use including timing and when to use back-up, call office when stop BF then switch to [place] or other method if desires 2. A2GDM, delivered-schedule GTT 3. Follow up in: 1 year or as needed.

## 2016-08-17 ENCOUNTER — Other Ambulatory Visit: Payer: Medicaid Other

## 2016-08-17 DIAGNOSIS — O99814 Abnormal glucose complicating childbirth: Secondary | ICD-10-CM

## 2016-08-18 ENCOUNTER — Telehealth: Payer: Self-pay | Admitting: *Deleted

## 2016-08-18 LAB — GLUCOSE TOLERANCE, 2 HOURS
Glucose, 2 hour: 116 mg/dL (ref 65–139)
Glucose, GTT - Fasting: 90 mg/dL (ref 65–99)

## 2016-08-18 NOTE — Telephone Encounter (Signed)
Per message from [place]- need to call patient and tell her gtt normal, GDM resolved

## 2016-08-18 NOTE — Telephone Encounter (Signed)
I called Jill Ray with her results of GTT ( normal)and she voices understanding.

## 2016-12-30 ENCOUNTER — Telehealth: Payer: Self-pay | Admitting: Family Medicine

## 2016-12-30 NOTE — Telephone Encounter (Signed)
Patient said she gave Birth 6 months ago and her Belly is not going down its staying the same.would like to speak to a nurse

## 2016-12-30 NOTE — Telephone Encounter (Signed)
Called patient and discussed her concerns. She states that she has abdominal tightness and her stomach is bloated. I advised patient to see her PCP for these concerns. If she needs to come back to [place] if its determined to be a gyn issue we would be happy to see her. Patient is agreeable to this.

## 2017-03-21 ENCOUNTER — Other Ambulatory Visit: Payer: Self-pay | Admitting: Certified Nurse Midwife

## 2017-03-29 ENCOUNTER — Other Ambulatory Visit: Payer: Self-pay | Admitting: Certified Nurse Midwife

## 2017-05-03 NOTE — L&D Delivery Note (Addendum)
Patient: Jill Ray MRN: [id number]  GBS status: negative, IAP given penicillin x1 for miscommunication of GBS status  Patient is a 37 y.o. now [id number] s/p NSVD at [redacted]w[redacted]d, who was admitted for IOL. SROM 0h 62m prior to delivery with heavy meconium stained fluid.    Delivery Note At 2:22 PM a viable female was delivered via Vaginal, Spontaneous (Presentation:vertex ; LOA).  APGAR: 7, 9; weight 8 lb 3.7 oz (3734 g).   Placenta status: delivered spontaneously,intact.  Cord: 3-vessel with the following complications: none.  Cord pH: pending  Anesthesia:  epidural Episiotomy: None Lacerations: 2nd degree Suture Repair: 2.0 vicryl rapide Est. Blood Loss (mL): 15  Mom to postpartum.  Baby to Couplet care / Skin to Skin.  Chelsey L Anderson 04/17/2018, 5:15 PM   Head delivered LOA. No nuchal cord present. Shoulder and body delivered in unusual fashion. Upon delivery, infant was found to have a compound arm and nuchal cord x2 which were easily reduced in delivering the body. Infant with spontaneous cry, placed on mother's abdomen, dried and bulb suctioned. Cord clamped x 2 and cut without delay for evaluation by pediatric team. Cord blood drawn. Placenta delivered spontaneously with gentle cord traction. Fundus firm with massage and Pitocin. Perineum inspected and found to have 2nd degree perineum laceration, which was repaired with locking stitch sutures and subcuticular for the superficial laceration with good hemostasis achieved.  Midwife attestation: I was gloved and present for delivery in its entirety and I agree with the above resident's note.  Julianne Handler, CNM 5:22 PM

## 2017-06-01 ENCOUNTER — Telehealth: Payer: Self-pay | Admitting: General Practice

## 2017-06-01 DIAGNOSIS — Z3041 Encounter for surveillance of contraceptive pills: Secondary | ICD-10-CM

## 2017-06-01 MED ORDER — NORETHINDRONE 0.35 MG PO TABS: 1.0000 | ORAL_TABLET | Freq: Every day | ORAL | 11 refills | Status: DC

## 2017-06-01 NOTE — Telephone Encounter (Signed)
Opened in error

## 2017-08-30 ENCOUNTER — Encounter: Payer: Self-pay | Admitting: *Deleted

## 2017-09-06 ENCOUNTER — Encounter: Payer: Self-pay | Admitting: *Deleted

## 2017-10-14 NOTE — Telephone Encounter (Signed)
Preadmission screen  

## 2017-12-09 ENCOUNTER — Encounter: Payer: Self-pay | Admitting: Family Medicine

## 2017-12-09 ENCOUNTER — Ambulatory Visit (INDEPENDENT_AMBULATORY_CARE_PROVIDER_SITE_OTHER): Payer: Medicaid Other

## 2017-12-09 DIAGNOSIS — Z3200 Encounter for pregnancy test, result unknown: Secondary | ICD-10-CM

## 2017-12-09 DIAGNOSIS — Z3201 Encounter for pregnancy test, result positive: Secondary | ICD-10-CM

## 2017-12-09 LAB — POCT PREGNANCY, URINE: PREG TEST UR: POSITIVE — AB

## 2017-12-09 MED ORDER — PRENATAL VITAMINS 0.8 MG PO TABS: 1.0000 | ORAL_TABLET | Freq: Every day | ORAL | 12 refills | Status: DC

## 2017-12-09 NOTE — Addendum Note (Signed)
Addended by: Alric Seton on: 12/09/2017 11:05 AM   Modules accepted: Orders

## 2017-12-09 NOTE — Progress Notes (Addendum)
Pt here today for a + preganacy test LMP 07/16/17    EDD 04/22/18 [redacted]w[redacted]d  Advised pt to seek prenatal care and begin prenatal vitiamns that I will send to pharmacy.  Pt verbalized understanding

## 2017-12-09 NOTE — Progress Notes (Signed)
Chart reviewed - agree with RN documentation.   

## 2017-12-27 ENCOUNTER — Encounter: Payer: Self-pay | Admitting: Medical

## 2017-12-27 ENCOUNTER — Ambulatory Visit (INDEPENDENT_AMBULATORY_CARE_PROVIDER_SITE_OTHER): Payer: Medicaid Other | Admitting: Medical

## 2017-12-27 ENCOUNTER — Ambulatory Visit: Payer: Medicaid Other | Admitting: Clinical

## 2017-12-27 ENCOUNTER — Encounter: Payer: Self-pay | Admitting: Family Medicine

## 2017-12-27 VITALS — BP 110/65 | HR 92 | Wt 181.4 lb

## 2017-12-27 DIAGNOSIS — O0990 Supervision of high risk pregnancy, unspecified, unspecified trimester: Secondary | ICD-10-CM | POA: Insufficient documentation

## 2017-12-27 DIAGNOSIS — O09522 Supervision of elderly multigravida, second trimester: Secondary | ICD-10-CM | POA: Diagnosis not present

## 2017-12-27 DIAGNOSIS — Z3482 Encounter for supervision of other normal pregnancy, second trimester: Secondary | ICD-10-CM | POA: Diagnosis not present

## 2017-12-27 DIAGNOSIS — Z8632 Personal history of gestational diabetes: Secondary | ICD-10-CM | POA: Diagnosis not present

## 2017-12-27 DIAGNOSIS — Z3403 Encounter for supervision of normal first pregnancy, third trimester: Secondary | ICD-10-CM

## 2017-12-27 DIAGNOSIS — O0993 Supervision of high risk pregnancy, unspecified, third trimester: Secondary | ICD-10-CM

## 2017-12-27 NOTE — Progress Notes (Signed)
   PRENATAL VISIT NOTE  Subjective:  Jill Ray is a 37 y.o. G4P3003 at [redacted]w[redacted]d being seen today for her first prenatal visit.  She is currently monitored for the following issues for this high-risk pregnancy and has History of gestational diabetes; Encounter for supervision of normal first pregnancy in third trimester; and AMA (advanced maternal age) multigravida 35+, second trimester on their problem list.  Patient reports no complaints.  Contractions: Not present. Vag. Bleeding: None.  Movement: Present. Denies leaking of fluid.   The following portions of the patient's history were reviewed and updated as appropriate: allergies, current medications, past family history, past medical history, past social history, past surgical history and problem list. Problem list updated.  Objective:   Vitals:   12/27/17 0902  BP: 110/65  Pulse: 92  Weight: 181 lb 6.4 oz (82.3 kg)    Fetal Status: Fetal Heart Rate (bpm): 161 Fundal Height: 25 cm Movement: Present     General:  Alert, oriented and cooperative. Patient is in no acute distress.  Skin: Skin is warm and dry. No rash noted.   Cardiovascular: Normal heart rate and rhythm noted  Respiratory: Normal respiratory effort, no problems with respiration noted. Clear to auscultation.   Abdomen: Soft, gravid, appropriate for gestational age. Normal bowel sounds. No tenderness to palpation  Pain/Pressure: Present     Pelvic: Cervical exam deferred        Extremities: Normal range of motion.  Edema: None  Mental Status: Normal mood and affect. Normal behavior. Normal judgment and thought content.   Assessment and Plan:  Pregnancy: G4P3003 at [redacted]w[redacted]d  1. Encounter for supervision of normal first pregnancy in third trimester - Culture, OB Urine - Genetic Screening - Hemoglobinopathy Evaluation - Obstetric Panel, Including HIV - SMN1 COPY NUMBER ANALYSIS (SMA Carrier Screen) - [place] MFM OB COMP + 29 WK; scheduled  - HgB A1c - Cervicovaginal  ancillary only  2. History of gestational diabetes - [place] MFM OB COMP + 55 WK; scheduled - HgB A1c  3. AMA (advanced maternal age) multigravida 21+, second trimester - Genetic Screening   Preterm labor/second trimester warning symptoms and general obstetric precautions including but not limited to vaginal bleeding, contractions, leaking of fluid and fetal movement were reviewed in detail with the patient. Please refer to After Visit Summary for other counseling recommendations.  Return in about 4 weeks (around 01/24/2018) for LOB, 28 week labs (fasting).  Future Appointments  Date Time Provider [place]  12/27/2017 10:00 AM [place]  12/29/2017  1:30 PM [place] 1 [id number]-MFCUS MFC-US    Kerry Hough, PA-C

## 2017-12-27 NOTE — Progress Notes (Signed)
Medicaid Home Form completed. 

## 2017-12-27 NOTE — BH Specialist Note (Addendum)
Integrated Behavioral Health Initial Visit  MRN: [id number] Name: Jill Ray  Number of [facility] Clinician visits:: 1/6 3 Total Session Start time: 9:34  Session End time: 9:42 Total time: 8 minutes  Type of Service: [place] Interpretor:No. Interpretor Name and Language: n/a   Warm Hand Off Completed.       SUBJECTIVE: Jill Ray is a 37 y.o. female accompanied by n/a Patient was referred by Kerry Hough, PA-C for Initial OB introduction to integrated behavioral health services . Patient reports the following symptoms/concerns: Pt states her primary concern today is fatigue; says that taking her prenatal vitamins help her feel better.  Duration of problem: Current pregnancy; Severity of problem: mild  OBJECTIVE: Mood: Normal and Affect: Appropriate Risk of harm to self or others: No plan to harm self or others  LIFE CONTEXT: Family and Social: Pt lives with her husband and 3 children School/Work: Pt works full time Self-Care: - Life Changes: Current pregnancy   GOALS ADDRESSED: Patient will: 1. Reduce symptoms of: depression 2. Increase knowledge and/or ability of: healthy habits  3. Demonstrate ability to: Increase healthy adjustment to current life circumstances  INTERVENTIONS: Interventions utilized: Supportive Counseling and Psychoeducation and/or Health Education  Standardized Assessments completed: GAD-7 and PHQ 9  ASSESSMENT: Patient currently experiencing Supervision of high-risk pregnancy, in third trimester   Patient may benefit from Initial OB introduction to integrated behavioral health services .  PLAN: 1. Follow up with behavioral health clinician on : As needed 2. Behavioral recommendations:  -Take prenatal vitamin daily, as recommended by medical provider -Read educational materials regarding coping with symptoms of depression  3. Referral(s): [facility] (In Clinic) 4. "From scale of 1-10, how likely are you to follow plan?": 9  Garlan Fair, LCSW  Depression screen [facility] 2/9 12/27/2017 08/16/2016 06/28/2016 06/21/2016 06/17/2016  Decreased Interest 2 0 2 2 2   Down, Depressed, Hopeless 1 0 1 1 1   PHQ - 2 Score 3 0 3 3 3   Altered sleeping 3 0 1 2 2   Tired, decreased energy 3 0 2 2 2   Change in appetite 3 0 2 2 2   Feeling bad or failure about yourself  0 0 0 1 1  Trouble concentrating 2 0 2 2 3   Moving slowly or fidgety/restless 2 0 2 2 2   Suicidal thoughts 0 0 0 0 0  PHQ-9 Score 16 0 12 14 15   Some recent data might be hidden   GAD 7 : Generalized Anxiety Score 12/27/2017 08/16/2016 06/28/2016 06/21/2016  Nervous, Anxious, on Edge 0 0 1 1  Control/stop worrying 1 0 2 2  Worry too much - different things 1 0 2 2  Trouble relaxing 1 0 2 2  Restless 1 0 2 1  Easily annoyed or irritable 1 0 2 2  Afraid - awful might happen 1 0 1 2  Total GAD 7 Score 6 0 12 12

## 2017-12-27 NOTE — Patient Instructions (Signed)
Prenatal Care WHAT IS PRENATAL CARE? Prenatal care is the process of caring for a pregnant woman before she gives birth. Prenatal care makes sure that she and her baby remain as healthy as possible throughout pregnancy. Prenatal care may be provided by a midwife, family practice health care provider, or a childbirth and pregnancy specialist (obstetrician). Prenatal care may include physical examinations, testing, treatments, and education on nutrition, lifestyle, and social support services. WHY IS PRENATAL CARE SO IMPORTANT? Early and consistent prenatal care increases the chance that you and your baby will remain healthy throughout your pregnancy. This type of care also decreases a baby's risk of being born too early (prematurely), or being born smaller than expected (small for gestational age). Any underlying medical conditions you may have that could pose a risk during your pregnancy are discussed during prenatal care visits. You will also be monitored regularly for any new conditions that may arise during your pregnancy so they can be treated quickly and effectively. WHAT HAPPENS DURING PRENATAL CARE VISITS? Prenatal care visits may include the following: Discussion Tell your health care provider about any new signs or symptoms you have experienced since your last visit. These might include:  Nausea or vomiting.  Increased or decreased level of energy.  Difficulty sleeping.  Back or leg pain.  Weight changes.  Frequent urination.  Shortness of breath with physical activity.  Changes in your skin, such as the development of a rash or itchiness.  Vaginal discharge or bleeding.  Feelings of excitement or nervousness.  Changes in your baby's movements.  You may want to write down any questions or topics you want to discuss with your health care provider and bring them with you to your appointment. Examination During your first prenatal care visit, you will likely have a complete  physical exam. Your health care provider will often examine your vagina, cervix, and the position of your uterus, as well as check your heart, lungs, and other body systems. As your pregnancy progresses, your health care provider will measure the size of your uterus and your baby's position inside your uterus. He or she may also examine you for early signs of labor. Your prenatal visits may also include checking your blood pressure and, after about 10-12 weeks of pregnancy, listening to your baby's heartbeat. Testing Regular testing often includes:  Urinalysis. This checks your urine for glucose, protein, or signs of infection.  Blood count. This checks the levels of white and red blood cells in your body.  Tests for sexually transmitted infections (STIs). Testing for STIs at the beginning of pregnancy is routinely done and is required in many states.  Antibody testing. You will be checked to see if you are immune to certain illnesses, such as rubella, that can affect a developing fetus.  Glucose screen. Around 24-28 weeks of pregnancy, your blood glucose level will be checked for signs of gestational diabetes. Follow-up tests may be recommended.  Group B strep. This is a bacteria that is commonly found inside a woman's vagina. This test will inform your health care provider if you need an antibiotic to reduce the amount of this bacteria in your body prior to labor and childbirth.  Ultrasound. Many pregnant women undergo an ultrasound screening around 18-20 weeks of pregnancy to evaluate the health of the fetus and check for any developmental abnormalities.  HIV (human immunodeficiency virus) testing. Early in your pregnancy, you will be screened for HIV. If you are at high risk for HIV, this test may   be repeated during your third trimester of pregnancy.  You may be offered other testing based on your age, personal or family medical history, or other factors. HOW OFTEN SHOULD I PLAN TO SEE MY  HEALTH CARE PROVIDER FOR PRENATAL CARE? Your prenatal care check-up schedule depends on any medical conditions you have before, or develop during, your pregnancy. If you do not have any underlying medical conditions, you will likely be seen for checkups:  Monthly, during the first 6 months of pregnancy.  Twice a month during months 7 and 8 of pregnancy.  Weekly starting in the 9th month of pregnancy and until delivery.  If you develop signs of early labor or other concerning signs or symptoms, you may need to see your health care provider more often. Ask your health care provider what prenatal care schedule is best for you. WHAT CAN I DO TO KEEP MYSELF AND MY BABY AS HEALTHY AS POSSIBLE DURING MY PREGNANCY?  Take a prenatal vitamin containing 400 micrograms (0.4 mg) of folic acid every day. Your health care provider may also ask you to take additional vitamins such as iodine, vitamin D, iron, copper, and zinc.  Take 1500-2000 mg of calcium daily starting at your 20th week of pregnancy until you deliver your baby.  Make sure you are up to date on your vaccinations. Unless directed otherwise by your health care provider: ? You should receive a tetanus, diphtheria, and pertussis (Tdap) vaccination between the 27th and 36th week of your pregnancy, regardless of when your last Tdap immunization occurred. This helps protect your baby from whooping cough (pertussis) after he or she is born. ? You should receive an annual inactivated influenza vaccine (IIV) to help protect you and your baby from influenza. This can be done at any point during your pregnancy.  Eat a well-rounded diet that includes: ? Fresh fruits and vegetables. ? Lean proteins. ? Calcium-rich foods such as milk, yogurt, hard cheeses, and dark, leafy greens. ? Whole grain breads.  Do noteat seafood high in mercury, including: ? Swordfish. ? Tilefish. ? Shark. ? King mackerel. ? More than 6 oz tuna per week.  Do not  eat: ? Raw or undercooked meats or eggs. ? Unpasteurized foods, such as soft cheeses (brie, blue, or feta), juices, and milks. ? Lunch meats. ? Hot dogs that have not been heated until they are steaming.  Drink enough water to keep your urine clear or pale yellow. For many women, this may be 10 or more 8 oz glasses of water each day. Keeping yourself hydrated helps deliver nutrients to your baby and may prevent the start of pre-term uterine contractions.  Do not use any tobacco products including cigarettes, chewing tobacco, or electronic cigarettes. If you need help quitting, ask your health care provider.  Do not drink beverages containing alcohol. No safe level of alcohol consumption during pregnancy has been determined.  Do not use any illegal drugs. These can harm your developing baby or cause a miscarriage.  Ask your health care provider or pharmacist before taking any prescription or over-the-counter medicines, herbs, or supplements.  Limit your caffeine intake to no more than 200 mg per day.  Exercise. Unless told otherwise by your health care provider, try to get 30 minutes of moderate exercise most days of the week. Do not  do high-impact activities, contact sports, or activities with a high risk of falling, such as horseback riding or downhill skiing.  Get plenty of rest.  Avoid anything that raises your  body temperature, such as hot tubs and saunas.  If you own a cat, do not empty its litter box. Bacteria contained in cat feces can cause an infection called toxoplasmosis. This can result in serious harm to the fetus.  Stay away from chemicals such as insecticides, lead, mercury, and cleaning or paint products that contain solvents.  Do not have any X-rays taken unless medically necessary.  Take a childbirth and breastfeeding preparation class. Ask your health care provider if you need a referral or recommendation.  This information is not intended to replace advice given  to you by your health care provider. Make sure you discuss any questions you have with your health care provider. Document Released: 04/22/2003 Document Revised: 09/22/2015 Document Reviewed: 07/04/2013 Elsevier Interactive Patient Education  2017 [place] Education Options: [facility] Department Classes:  Childbirth education classes can help you get ready for a positive parenting experience. You can also meet other expectant parents and get free stuff for your baby. Each class runs for five weeks on the same night and costs $45 for the mother-to-be and her support person. Medicaid covers the cost if you are eligible. Call [phone] to register. [facility] Childbirth Education:  [phone] or [phone] or [email or web address]  Baby & Me Class: Discuss newborn & infant parenting and family adjustment issues with other new mothers in a relaxed environment. Each week brings a new speaker or baby-centered activity. We encourage new mothers to join [place] every Thursday at 11:00am. Babies birth until crawling. No registration or fee. Daddy [employer]: This course offers Dads-to-be the tools and knowledge needed to feel confident on their journey to becoming new fathers. Experienced dads, who have been trained as coaches, teach dads-to-be how to hold, comfort, diaper, swaddle and play with their infant while being able to support the new mom as well. A class for men taught by men. $25/dad Big Brother/Big Sister: Let your children share in the joy of a new brother or sister in this special class designed just for them. Class includes discussion about how families care for babies: swaddling, holding, diapering, safety as well as how they can be helpful in their new role. This class is designed for children ages 43 to 60, but any age is welcome. Please register each child individually. $5/child  Mom Talk: This mom-led group offers support and connection to mothers as  they journey through the adjustments and struggles of that sometimes overwhelming first year after the birth of a child. Tuesdays at 10:00am and Thursdays at 6:00pm. Babies welcome. No registration or fee. Breastfeeding Support Group: This group is a mother-to-mother support circle where moms have the opportunity to share their breastfeeding experiences. A Lactation Consultant is present for questions and concerns. Meets each Tuesday at 11:00am. No fee or registration. Breastfeeding Your Baby: Learn what to expect in the first days of breastfeeding your newborn.  This class will help you feel more confident with the skills needed to begin your breastfeeding experience. Many new mothers are concerned about breastfeeding after leaving the hospital. This class will also address the most common fears and challenges about breastfeeding during the first few weeks, months and beyond. (call for fee) Comfort Techniques and Tour: This 2 hour interactive class will provide you the opportunity to learn & practice hands-on techniques that can help relieve some of the discomfort of labor and encourage your baby to rotate toward the best position for birth. You and your partner will be able to try  a variety of labor positions with birth balls and rebozos as well as practice breathing, relaxation, and visualization techniques. A tour of the [facility] is included with this class. $20 per registrant and support person Childbirth Class- Weekend Option: This class is a Weekend version of our Birth & Baby series. It is designed for parents who have a difficult time fitting several weeks of classes into their schedule. It covers the care of your newborn and the basics of labor and childbirth. It also includes a [place] [facility] and lunch. The class is held two consecutive days: beginning on Friday evening from 6:30 - 8:30 p.m. and the next day, Saturday from 9 a.m. - 4 p.m.  (call for fee) Doren Custard Class: Interested in a waterbirth?  This informational class will help you discover whether waterbirth is the right fit for you. Education about waterbirth itself, supplies you would need and how to assemble your support team is what you can expect from this class. Some obstetrical practices require this class in order to pursue a waterbirth. (Not all obstetrical practices offer waterbirth-check with your healthcare provider.) Register only the expectant mom, but you are encouraged to bring your partner to class! Required if planning waterbirth, no fee. Infant/Child CPR: Parents, grandparents, babysitters, and friends learn Cardio-Pulmonary Resuscitation skills for infants and children. You will also learn how to treat both conscious and unconscious choking in infants and children. This Family & Friends program does not offer certification. Register each participant individually to ensure that enough mannequins are available. (Call for fee) Grandparent Love: Expecting a grandbaby? This class is for you! Learn about the latest infant care and safety recommendations and ways to support your own child as he or she transitions into the parenting role. Taught by Registered Nurses who are childbirth instructors, but most importantly...they are grandmothers too! $10/person. Childbirth Class- Natural Childbirth: This series of 5 weekly classes is for expectant parents who want to learn and practice natural methods of coping with the process of labor and childbirth. Relaxation, breathing, massage, visualization, role of the partner, and helpful positioning are highlighted. Participants learn how to be confident in their body's ability to give birth. This class will empower and help parents make informed decisions about their own care. Includes discussion that will help new parents transition into the immediate postpartum period. [facility] is included. We  suggest taking this class between 25-32 weeks, but it's only a recommendation. $75 per registrant and one support person or $30 Medicaid. Childbirth Class- 3 week Series: This option of 3 weekly classes helps you and your labor partner prepare for childbirth. Newborn care, labor & birth, cesarean birth, pain management, and comfort techniques are discussed and a [place] [facility] is included. The class meets at the same time, on the same day of the week for 3 consecutive weeks beginning with the starting date you choose. $60 for registrant and one support person.  Marvelous Multiples: Expecting twins, triplets, or more? This class covers the differences in labor, birth, parenting, and breastfeeding issues that face multiples' parents. NICU tour is included. Led by a Certified Childbirth Educator who is the mother of twins. No fee. Caring for Baby: This class is for expectant and adoptive parents who want to learn and practice the most up-to-date newborn care for their babies. Focus is on birth through the first six weeks of life. Topics include feeding, bathing, diapering,  crying, umbilical cord care, circumcision care and safe sleep. Parents learn to recognize symptoms of illness and when to call the pediatrician. Register only the mom-to-be and your partner or support person can plan to come with you! $10 per registrant and support person Childbirth Class- online option: This online class offers you the freedom to complete a Birth and Baby series in the comfort of your own home. The flexibility of this option allows you to review sections at your own pace, at times convenient to you and your support people. It includes additional video information, animations, quizzes, and extended activities. Get organized with helpful eClass tools, checklists, and trackers. Once you register online for the class, you will receive an email within a few days to accept the invitation and begin the  class when the time is right for you. The content will be available to you for 60 days. $60 for 60 days of online access for you and your support people.  Local Doulas: Natural Baby Doulas [email or web address] Tel: [phone] [email or web address] [employer] [phone] [email or web address] The Labor Hassell Halim  (also do waterbirth tub rental) [phone] [email or web address] Triad Birth Doula [phone] [email or web address] NotebookDistributors.fi Sacred Rhythms  [phone] https://[email or web address]-[email or web address]/ [employer] (PADA) [email or web address]://[email or web address]/ [place] Birth and Baby  [email or web address]://[email or web address]/ Considering Waterbirth? Guide for patients at [facility] [employer]  Why consider waterbirth?  . Gentle birth for babies . Less pain medicine used in labor . May allow for passive descent/less pushing . May reduce perineal tears  . More mobility and instinctive maternal position changes . Increased maternal relaxation . Reduced blood pressure in labor  Is waterbirth safe? What are the risks of infection, drowning or other complications?  . Infection: o Very low risk (3.7 % for tub vs 4.8% for bed) o 7 in 8000 waterbirths with documented infection o Poorly cleaned equipment most common cause o Slightly lower group B strep transmission rate  . Drowning o Maternal:  - Very low risk   - Related to seizures or fainting o Newborn:  - Very low risk. No evidence of increased risk of respiratory problems in multiple large studies - Physiological protection from breathing under water - Avoid underwater birth if there are any fetal complications - Once baby's head is out of the water, keep it out.  . Birth complication o Some reports of cord trauma, but risk decreased by bringing baby to surface  gradually o No evidence of increased risk of shoulder dystocia. Mothers can usually change positions faster in water than in a bed, possibly aiding the maneuvers to free the shoulder.   You must attend a Doren Custard class at [facility]  3rd Wednesday of every month from 7-9pm  [employer] by calling [phone] or online at [email or web address]  Bring [place] the certificate from the class to your prenatal appointment  Meet with a midwife at 36 weeks to see if you can still plan a waterbirth and to sign the consent.   Purchase or rent the following supplies:   Water Birth Pool (Birth Pool in a Box or [place] for instance)  (Tubs start ~$125)  Single-use disposable tub liner designed for your brand of tub  New garden hose labeled "lead-free", "suitable for drinking water",  Electric drain pump to remove water (We recommend 792 gallon per hour or greater pump.)   Separate garden hose to remove the dirty water  Fish net  Bathing suit top (optional)  Long-handled mirror (optional)  Places to purchase or rent supplies  GotWebTools.is for tub purchases and supplies  Waterbirthsolutions.com for tub purchases and supplies  The Labor Ladies (www.[email or web address]) $275 for tub rental/set-up & take down/kit   [employer] (http://www.fleming.com/.htm) Information regarding doulas (labor support) who provide pool rentals  Our practice has a Birth Pool in a Box tub at the hospital that you may borrow on a first-come-first-served basis. It is your responsibility to to set up, clean and break down the tub. We cannot guarantee the availability of this tub in advance. You are responsible for bringing all accessories listed above. If you do not have all necessary supplies you cannot have a waterbirth.    Things that would prevent you from having a waterbirth:  Premature, <37wks  Previous cesarean birth  Presence of thick meconium-stained  fluid  Multiple gestation (Twins, triplets, etc.)  Uncontrolled diabetes or gestational diabetes requiring medication  Hypertension requiring medication or diagnosis of pre-eclampsia  Heavy vaginal bleeding  Non-reassuring fetal heart rate  Active infection (MRSA, etc.). Group B Strep is NOT a contraindication for  waterbirth.  If your labor has to be induced and induction method requires continuous  monitoring of the baby's heart rate  Other risks/issues identified by your obstetrical provider  Please remember that birth is unpredictable. Under certain unforeseeable circumstances your provider may advise against giving birth in the tub. These decisions will be made on a case-by-case basis and with the safety of you and your baby as our highest priority.     AREA PEDIATRIC/FAMILY [place] [address] [place], [place]  [address] Phone - [phone]   Fax - [phone]  [facility] [place] [address] [place], [place] [address] Phone - [phone]   Fax - [place] [address], [place]  [address] Phone - [phone]   Fax - [phone]  [place] [place] [address] [place], [place]  [address] Phone - [phone]   Fax - [phone]  [place] [address] [place], [place]  [address] Phone - [phone]   Fax - [phone]  [facility] [address] [place], [place]  [address] Phone - [phone]   Fax - [place] [address], [place], [place]  [address] Phone - [phone]   Fax - [phone]  [place] [address] [place], [place] [address] [place], [place]  [address] Phone - [phone]   Fax - [place] [address] [place], [place]  [address] Phone - [phone]    Fax - [phone] [facility] [place]. [address] [place], [place]  [address] Phone - [phone]   Fax - [phone]  [place] [address] [place], [place]  [address] Phone - [phone]   Fax - [phone]  [facility] [place], [place], [place]  [address] Phone - [phone]   Fax - [place] [address] [place], [place]  [address] Phone - [phone]   Fax - [phone]  [facility] [address], [place], [place]  [address] Phone - [place], [place]  [address] Phone - [phone]   Fax - [place] [address], [place], [place]  [address] Phone -  [phone]   Fax - [phone]  [place] [address] [place], [place]  [address] Phone - [phone]   Fax - [place], [place]  [address] Phone - [phone]   Fax - [place], [place], [place]  [address] Phone - [phone]   Fax - [place] [address], [place], [place]  [address] Phone - [phone]   Fax - [phone]  [place] RUB[place] [address], [place], [place]  [address] Phone - [phone]   Fax - [place] [address], [place], [place]  [address] Phone - [phone]   Fax - [phone]  [place] [address] [place], [place]  [address] Phone - [phone]   Fax - [phone] [place] [address] [place], [place]  [address] Phone - [phone]   Fax - [place] [address] [place], [place]  [address] Phone - [phone]   Fax -  [place] [address], [place], [place]  [address] Phone - [phone]   Fax - [phone]  [place] [address] [place] [address] Phone [phone]  Fax [phone]  Safe Medications in Pregnancy   Acne:  Benzoyl Peroxide  Salicylic Acid   Backache/Headache:  Tylenol: 2 regular strength every 4 hours OR        2 Extra strength every 6 hours   Colds/Coughs/Allergies:  Benadryl (alcohol free) 25 mg every 6 hours as needed  Breath right strips  Claritin  Cepacol throat lozenges  Chloraseptic throat spray  Cold-Eeze- up to three times per day  Cough drops, alcohol free  Flonase (by prescription only)  Guaifenesin  Mucinex  Robitussin DM (plain only, alcohol free)  Saline nasal spray/drops  Sudafed (pseudoephedrine) & Actifed * use only after [redacted] weeks gestation and if you do not have high blood pressure  Tylenol  Vicks Vaporub  Zinc lozenges  Zyrtec   Constipation:  Colace  Ducolax suppositories  Fleet enema  Glycerin suppositories  Metamucil  Milk of magnesia  Miralax  Senokot  Smooth move tea   Diarrhea:  Kaopectate  Imodium A-D   *NO pepto Bismol   Hemorrhoids:  Anusol  Anusol HC  Preparation H  Tucks   Indigestion:  Tums  Maalox  Mylanta  Zantac  Pepcid   Insomnia:  Benadryl (alcohol free) 5m every 6 hours as needed  Tylenol PM  Unisom, no Gelcaps   Leg Cramps:  Tums  MagGel   Nausea/Vomiting:  Bonine  Dramamine  Emetrol  Ginger extract  Sea bands  Meclizine  Nausea medication to take during pregnancy:  Unisom (doxylamine succinate 25 mg tablets) Take one tablet daily at bedtime. If symptoms are not adequately controlled, the dose can be increased to a maximum recommended dose of two tablets daily (1/2 tablet in the morning, 1/2 tablet mid-afternoon and one at bedtime).  Vitamin B6 1085mtablets.  Take one tablet twice a day (up to 200 mg per day).   Skin Rashes:  Aveeno products  Benadryl cream or 2543mvery 6 hours as needed  Calamine Lotion  1% cortisone cream   Yeast infection:  Gyne-lotrimin 7  Monistat 7    **If taking multiple medications, please check labels to avoid  duplicating the same active ingredients  **take medication as directed on the label  ** Do not exceed 4000 mg of tylenol in 24 hours  **Do not take medications that contain aspirin or ibuprofen

## 2017-12-28 ENCOUNTER — Encounter (HOSPITAL_COMMUNITY): Payer: Self-pay

## 2017-12-29 ENCOUNTER — Other Ambulatory Visit (HOSPITAL_COMMUNITY): Payer: Self-pay

## 2017-12-29 LAB — URINE CULTURE, OB REFLEX

## 2017-12-29 LAB — CULTURE, OB URINE

## 2018-01-04 ENCOUNTER — Ambulatory Visit (HOSPITAL_COMMUNITY)
Admission: RE | Admit: 2018-01-04 | Discharge: 2018-01-04 | Disposition: A | Discharge status: Home / Self Care | Payer: Medicaid Other | Source: Ambulatory Visit | Attending: Medical | Admitting: Medical

## 2018-01-04 ENCOUNTER — Encounter: Payer: Self-pay | Admitting: Medical

## 2018-01-04 DIAGNOSIS — Z8632 Personal history of gestational diabetes: Secondary | ICD-10-CM | POA: Diagnosis not present

## 2018-01-04 DIAGNOSIS — Z363 Encounter for antenatal screening for malformations: Secondary | ICD-10-CM

## 2018-01-04 DIAGNOSIS — Z3A24 24 weeks gestation of pregnancy: Secondary | ICD-10-CM | POA: Insufficient documentation

## 2018-01-04 DIAGNOSIS — O99212 Obesity complicating pregnancy, second trimester: Secondary | ICD-10-CM | POA: Diagnosis not present

## 2018-01-04 DIAGNOSIS — Z3403 Encounter for supervision of normal first pregnancy, third trimester: Secondary | ICD-10-CM

## 2018-01-04 DIAGNOSIS — O09522 Supervision of elderly multigravida, second trimester: Secondary | ICD-10-CM | POA: Diagnosis not present

## 2018-01-04 DIAGNOSIS — O09293 Supervision of pregnancy with other poor reproductive or obstetric history, third trimester: Secondary | ICD-10-CM | POA: Diagnosis not present

## 2018-01-04 DIAGNOSIS — O403XX0 Polyhydramnios, third trimester, not applicable or unspecified: Secondary | ICD-10-CM | POA: Insufficient documentation

## 2018-01-05 ENCOUNTER — Other Ambulatory Visit (HOSPITAL_COMMUNITY): Payer: Self-pay | Admitting: *Deleted

## 2018-01-05 DIAGNOSIS — O09523 Supervision of elderly multigravida, third trimester: Secondary | ICD-10-CM

## 2018-01-05 DIAGNOSIS — O403XX0 Polyhydramnios, third trimester, not applicable or unspecified: Secondary | ICD-10-CM

## 2018-01-06 ENCOUNTER — Encounter: Payer: Self-pay | Admitting: *Deleted

## 2018-01-06 LAB — OBSTETRIC PANEL, INCLUDING HIV
Antibody Screen: NEGATIVE
Basophils Absolute: 0 10*3/uL (ref 0.0–0.2)
Basos: 0 %
EOS (ABSOLUTE): 0.1 10*3/uL (ref 0.0–0.4)
EOS: 1 %
HIV Screen 4th Generation wRfx: NONREACTIVE
Hematocrit: 34.1 % (ref 34.0–46.6)
Hemoglobin: 12.1 g/dL (ref 11.1–15.9)
Hepatitis B Surface Ag: NEGATIVE
IMMATURE GRANULOCYTES: 1 %
Immature Grans (Abs): 0 10*3/uL (ref 0.0–0.1)
LYMPHS ABS: 1.2 10*3/uL (ref 0.7–3.1)
Lymphs: 17 %
MCH: 32.6 pg (ref 26.6–33.0)
MCHC: 35.5 g/dL (ref 31.5–35.7)
MCV: 92 fL (ref 79–97)
Monocytes Absolute: 0.5 10*3/uL (ref 0.1–0.9)
Monocytes: 7 %
NEUTROS ABS: 5.2 10*3/uL (ref 1.4–7.0)
NEUTROS PCT: 74 %
Platelets: 190 10*3/uL (ref 150–450)
RBC: 3.71 x10E6/uL — ABNORMAL LOW (ref 3.77–5.28)
RDW: 13.6 % (ref 12.3–15.4)
RH TYPE: POSITIVE
RPR: NONREACTIVE
Rubella Antibodies, IGG: 27.4 index (ref >0.99)
WBC: 7 10*3/uL (ref 3.4–10.8)

## 2018-01-06 LAB — HEMOGLOBINOPATHY EVALUATION
Ferritin: 29 ng/mL (ref 15–150)
HGB A2 QUANT: 2.1 % (ref 1.8–3.2)
HGB A: 97.9 % (ref 96.4–98.8)
HGB C: 0 %
HGB S: 0 %
HGB VARIANT: 0 %
Hgb F Quant: 0 % (ref 0.0–2.0)
Hgb Solubility: NEGATIVE

## 2018-01-06 LAB — SMN1 COPY NUMBER ANALYSIS (SMA CARRIER SCREENING)

## 2018-01-06 LAB — HEMOGLOBIN A1C
Est. average glucose Bld gHb Est-mCnc: 111 mg/dL
HEMOGLOBIN A1C: 5.5 % (ref 4.8–5.6)

## 2018-01-09 ENCOUNTER — Encounter: Payer: Self-pay | Admitting: *Deleted

## 2018-01-23 ENCOUNTER — Encounter: Payer: Self-pay | Admitting: *Deleted

## 2018-01-24 ENCOUNTER — Other Ambulatory Visit: Payer: Medicaid Other

## 2018-01-24 ENCOUNTER — Other Ambulatory Visit: Payer: Self-pay | Admitting: General Practice

## 2018-01-24 ENCOUNTER — Ambulatory Visit (INDEPENDENT_AMBULATORY_CARE_PROVIDER_SITE_OTHER): Payer: Medicaid Other | Admitting: Student

## 2018-01-24 VITALS — BP 111/66 | HR 93 | Wt 182.0 lb

## 2018-01-24 DIAGNOSIS — Z3403 Encounter for supervision of normal first pregnancy, third trimester: Secondary | ICD-10-CM

## 2018-01-24 DIAGNOSIS — Z23 Encounter for immunization: Secondary | ICD-10-CM

## 2018-01-24 DIAGNOSIS — O403XX0 Polyhydramnios, third trimester, not applicable or unspecified: Secondary | ICD-10-CM | POA: Diagnosis not present

## 2018-01-24 DIAGNOSIS — Z8632 Personal history of gestational diabetes: Secondary | ICD-10-CM

## 2018-01-24 DIAGNOSIS — O09522 Supervision of elderly multigravida, second trimester: Secondary | ICD-10-CM | POA: Diagnosis not present

## 2018-01-24 NOTE — Patient Instructions (Signed)
Polyhydramnios When a woman becomes pregnant, a sac is formed around the fertilized egg (embryo) and later the growing baby (fetus). This sac is called the amniotic sac. The amniotic sac is filled with fluid. It gets bigger as the pregnancy grows. When there is too much fluid in the sac, it is called polyhydramnios. All babies born with polyhydramnios should be checked for congenital abnormalities. The amniotic fluid cushions and protects the baby. It also provides the baby with fluids and is crucial to normal development. Your baby breathes this fluid into its lungs and swallows it. This helps promote the healthy growth of the lungs and gastrointestinal tract. Amniotic fluid also helps the baby move around, helping with the normal development of muscle and bone. What are the causes? This condition is caused by:  Diabetes mellitus.  Down's syndrome, fetal abnormalities of the intestinal tract, and anencephaly (fetus without brain), all of which can prevent the fetus from swallowing the amniotic fluid.  One twins passes (transfuses) its blood into the other twin (twin-twin transfusion syndrome).  Medical illness of the mother, e.g., kidney or heart disease.  Tumor (chorioangioma) of the placenta.  What are the signs or symptoms? Symptoms of this condition include:  Enlarged uterus. The size of the uterus enlarges beyond what it should be for that particular time of the pregnancy.  Pressure and discomfort. The mother may feel more pressure and discomfort than should be expected.  Quick and unexpected enlargement of the mother's stomach.  How is this diagnosed? This condition is diagnosed when your health care provider measures you and notices that your uterus is beyond the size that is consistent with the time of the pregnancy.  An ultrasound is then used (abdominally or vaginally) to: ? See if you are carrying twins or more. ? Measure the growth of the baby. ? Look for birth  defects. ? Measure the amount of fluid in the amniotic sac.  Amniotic Fluid Index (AFI) measures the amount of fluid in the amniotic sac in four different areas. If there is more than 24 centimeters, you have polyhydramnios. How is this treated? This condition may be treated by:  Removing some fluid from the amniotic sac.  Taking medications that lower the fluids in your body.  Stopping the use of salt or salty foods because it causes you to keep fluid in your body (retention).  Follow these instructions at home:  Keep all your prenatal visits as told by your health care provider. It is important to follow your health care provider's recommendations.  Do not eat a lot of salt and salty foods.  If you have diabetes, keep it under control.  If you have heart or kidney disease, treat the disease as told by your health care provider. Contact a health care provider if:  You think your uterus has grown too fast in a short period of time.  You feel a great amount of pressure in your lower belly (pelvis) and are more uncomfortable than expected. Get help right away if:  You have a gush of fluid or are leaking fluid from your vagina.  You stop feeling the baby move.  You do not feel the baby kicking as much as usual.  You have a hard time keeping your diabetes under control.  You are having problems with your heart or kidney disease. This information is not intended to replace advice given to you by your health care provider. Make sure you discuss any questions you have with your health  care provider. Document Released: 07/10/2002 Document Revised: 09/25/2015 Document Reviewed: 11/16/2012 Elsevier Interactive Patient Education  [employer].

## 2018-01-24 NOTE — Progress Notes (Signed)
   PRENATAL VISIT NOTE  Subjective:  Jill Ray is a 37 y.o. G4P3003 at [redacted]w[redacted]d being seen today for ongoing prenatal care.  She is currently monitored for the following issues for this low-risk pregnancy and has History of gestational diabetes; Encounter for supervision of normal first pregnancy in third trimester; AMA (advanced maternal age) multigravida 35+, second trimester; and Polyhydramnios affecting pregnancy in third trimester on their problem list.  Patient reports no complaints.  Contractions: Irritability. Vag. Bleeding: None.  Movement: Present. Denies leaking of fluid.   The following portions of the patient's history were reviewed and updated as appropriate: allergies, current medications, past family history, past medical history, past social history, past surgical history and problem list. Problem list updated.  Objective:   Vitals:   01/24/18 0859  BP: 111/66  Pulse: 93  Weight: 182 lb (82.6 kg)    Fetal Status: Fetal Heart Rate (bpm): 132 Fundal Height: 28 cm Movement: Present     General:  Alert, oriented and cooperative. Patient is in no acute distress.  Skin: Skin is warm and dry. No rash noted.   Cardiovascular: Normal heart rate noted  Respiratory: Normal respiratory effort, no problems with respiration noted  Abdomen: Soft, gravid, appropriate for gestational age.  Pain/Pressure: Absent     Pelvic: Cervical exam deferred        Extremities: Normal range of motion.  Edema: None  Mental Status: Normal mood and affect. Normal behavior. Normal judgment and thought content.   Assessment and Plan:  Pregnancy: G4P3003 at [redacted]w[redacted]d  1. Encounter for supervision of normal first pregnancy in third trimester -Discussed nexplanon; patient is interested - Tdap vaccine greater than or equal to 7yo IM  2. History of gestational diabetes -2 hour in process  3. Polyhydramnios affecting pregnancy in third trimester Follow up with MFM next week; patient is planning to  keep that appointment  4. AMA (advanced maternal age) multigravida 34+, second trimester   Preterm labor symptoms and general obstetric precautions including but not limited to vaginal bleeding, contractions, leaking of fluid and fetal movement were reviewed in detail with the patient. Please refer to After Visit Summary for other counseling recommendations.  Return in about 2 weeks (around 02/07/2018), or LROB.  Future Appointments  Date Time Provider [place]  02/01/2018  4:00 PM [facility]-MFC [place] 3 WH-MFCUS MFC-US    Mashell Sieben Lorraine Kenji Mapel, CNM

## 2018-01-25 LAB — GLUCOSE TOLERANCE, 2 HOURS W/ 1HR
GLUCOSE, 1 HOUR: 201 mg/dL — AB (ref 65–179)
GLUCOSE, 2 HOUR: 168 mg/dL — AB (ref 65–152)
GLUCOSE, FASTING: 97 mg/dL — AB (ref 65–91)

## 2018-01-27 ENCOUNTER — Other Ambulatory Visit: Payer: Self-pay | Admitting: Student

## 2018-01-27 ENCOUNTER — Encounter: Payer: Self-pay | Admitting: Student

## 2018-01-27 DIAGNOSIS — O24919 Unspecified diabetes mellitus in pregnancy, unspecified trimester: Secondary | ICD-10-CM | POA: Insufficient documentation

## 2018-01-27 MED ORDER — ASPIRIN EC 81 MG PO TBEC: 81.0000 mg | DELAYED_RELEASE_TABLET | Freq: Once | ORAL | 2 refills | Status: AC

## 2018-01-31 ENCOUNTER — Telehealth: Payer: Self-pay | Admitting: *Deleted

## 2018-01-31 ENCOUNTER — Encounter: Payer: Self-pay | Admitting: Student

## 2018-01-31 DIAGNOSIS — Z3009 Encounter for other general counseling and advice on contraception: Secondary | ICD-10-CM | POA: Diagnosis not present

## 2018-01-31 DIAGNOSIS — Z1388 Encounter for screening for disorder due to exposure to contaminants: Secondary | ICD-10-CM | POA: Diagnosis not present

## 2018-01-31 DIAGNOSIS — Z0389 Encounter for observation for other suspected diseases and conditions ruled out: Secondary | ICD-10-CM | POA: Diagnosis not present

## 2018-01-31 NOTE — Telephone Encounter (Signed)
Called pt and informed her of 2hr GTT results showing she has gestational diabetes. She will need appt w/diabetes educator to review diet and self testing of blood sugar.  Pt was offered appt on 10/3 but stated she is working that Jill Ray. She voiced understanding of plan of care and agreed to appt on 10/8 @ 1000.

## 2018-02-01 ENCOUNTER — Encounter (HOSPITAL_COMMUNITY): Payer: Self-pay

## 2018-02-01 ENCOUNTER — Ambulatory Visit (HOSPITAL_COMMUNITY)
Admission: RE | Admit: 2018-02-01 | Discharge: 2018-02-01 | Disposition: A | Discharge status: Home / Self Care | Payer: Medicaid Other | Source: Ambulatory Visit | Attending: Medical | Admitting: Medical

## 2018-02-01 DIAGNOSIS — O99212 Obesity complicating pregnancy, second trimester: Secondary | ICD-10-CM | POA: Diagnosis not present

## 2018-02-01 DIAGNOSIS — O403XX0 Polyhydramnios, third trimester, not applicable or unspecified: Secondary | ICD-10-CM | POA: Insufficient documentation

## 2018-02-01 DIAGNOSIS — O09523 Supervision of elderly multigravida, third trimester: Secondary | ICD-10-CM | POA: Insufficient documentation

## 2018-02-01 DIAGNOSIS — O09893 Supervision of other high risk pregnancies, third trimester: Secondary | ICD-10-CM

## 2018-02-01 DIAGNOSIS — Z3A28 28 weeks gestation of pregnancy: Secondary | ICD-10-CM | POA: Insufficient documentation

## 2018-02-01 DIAGNOSIS — Z3403 Encounter for supervision of normal first pregnancy, third trimester: Secondary | ICD-10-CM

## 2018-02-02 ENCOUNTER — Other Ambulatory Visit (HOSPITAL_COMMUNITY): Payer: Self-pay | Admitting: *Deleted

## 2018-02-02 DIAGNOSIS — O99213 Obesity complicating pregnancy, third trimester: Secondary | ICD-10-CM

## 2018-02-07 ENCOUNTER — Encounter: Payer: Self-pay | Admitting: Obstetrics and Gynecology

## 2018-02-07 ENCOUNTER — Ambulatory Visit (INDEPENDENT_AMBULATORY_CARE_PROVIDER_SITE_OTHER): Payer: Medicaid Other | Admitting: Obstetrics & Gynecology

## 2018-02-07 ENCOUNTER — Other Ambulatory Visit: Payer: Self-pay

## 2018-02-07 ENCOUNTER — Encounter: Payer: Self-pay | Admitting: Obstetrics & Gynecology

## 2018-02-07 ENCOUNTER — Ambulatory Visit: Payer: Medicaid Other | Admitting: *Deleted

## 2018-02-07 ENCOUNTER — Encounter: Payer: Medicaid Other | Attending: Obstetrics and Gynecology | Admitting: *Deleted

## 2018-02-07 VITALS — BP 116/66 | HR 99 | Wt 182.9 lb

## 2018-02-07 DIAGNOSIS — Z8632 Personal history of gestational diabetes: Secondary | ICD-10-CM

## 2018-02-07 DIAGNOSIS — K219 Gastro-esophageal reflux disease without esophagitis: Secondary | ICD-10-CM

## 2018-02-07 DIAGNOSIS — O09523 Supervision of elderly multigravida, third trimester: Secondary | ICD-10-CM

## 2018-02-07 DIAGNOSIS — O24419 Gestational diabetes mellitus in pregnancy, unspecified control: Secondary | ICD-10-CM | POA: Insufficient documentation

## 2018-02-07 DIAGNOSIS — Z3403 Encounter for supervision of normal first pregnancy, third trimester: Secondary | ICD-10-CM | POA: Diagnosis not present

## 2018-02-07 DIAGNOSIS — O24410 Gestational diabetes mellitus in pregnancy, diet controlled: Secondary | ICD-10-CM

## 2018-02-07 DIAGNOSIS — O09522 Supervision of elderly multigravida, second trimester: Secondary | ICD-10-CM

## 2018-02-07 DIAGNOSIS — Z3A00 Weeks of gestation of pregnancy not specified: Secondary | ICD-10-CM | POA: Insufficient documentation

## 2018-02-07 DIAGNOSIS — Z713 Dietary counseling and surveillance: Secondary | ICD-10-CM | POA: Diagnosis not present

## 2018-02-07 DIAGNOSIS — O99613 Diseases of the digestive system complicating pregnancy, third trimester: Secondary | ICD-10-CM

## 2018-02-07 DIAGNOSIS — O99619 Diseases of the digestive system complicating pregnancy, unspecified trimester: Secondary | ICD-10-CM

## 2018-02-07 DIAGNOSIS — O24913 Unspecified diabetes mellitus in pregnancy, third trimester: Secondary | ICD-10-CM

## 2018-02-07 DIAGNOSIS — O24919 Unspecified diabetes mellitus in pregnancy, unspecified trimester: Secondary | ICD-10-CM

## 2018-02-07 DIAGNOSIS — O403XX0 Polyhydramnios, third trimester, not applicable or unspecified: Secondary | ICD-10-CM

## 2018-02-07 LAB — POCT URINALYSIS DIP (DEVICE)
BILIRUBIN URINE: NEGATIVE
Glucose, UA: NEGATIVE mg/dL
HGB URINE DIPSTICK: NEGATIVE
KETONES UR: NEGATIVE mg/dL
Leukocytes, UA: NEGATIVE
Nitrite: NEGATIVE
PH: 6.5 (ref 5.0–8.0)
Protein, ur: NEGATIVE mg/dL
Specific Gravity, Urine: 1.015 (ref 1.005–1.030)
Urobilinogen, UA: 0.2 mg/dL (ref 0.0–1.0)

## 2018-02-07 MED ORDER — ACCU-CHEK GUIDE W/DEVICE KIT: 1.0000 | PACK | Freq: Four times a day (QID) | 0 refills | Status: DC

## 2018-02-07 MED ORDER — ACCU-CHEK FASTCLIX LANCETS MISC: 1.0000 | Freq: Four times a day (QID) | 12 refills | Status: DC

## 2018-02-07 MED ORDER — GLUCOSE BLOOD VI STRP: ORAL_STRIP | 12 refills | Status: DC

## 2018-02-07 MED ORDER — RANITIDINE HCL 150 MG PO TABS: 150.0000 mg | ORAL_TABLET | Freq: Two times a day (BID) | ORAL | 2 refills | Status: DC

## 2018-02-07 NOTE — Progress Notes (Signed)
  Patient was seen on 02/07/2018 for Gestational Diabetes self-management. EDD 04/22/2018. Patient states history of GDM with last pregnancy 1 year ago. Diet history obtained. Patient eats good variety of all food groups however she typically eats larger portions of rice at most meals. Beverages include water, tea with milk and two spoons sugar, and occasional box of fruit juice. She works 3 days a week at [employer] on [employer], standing all day. She states the walk from parking lot to the building is quite far too.  The following learning objectives were met by the patient :   States the definition of Gestational Diabetes  States why dietary management is important in controlling blood glucose  Describes the effects of carbohydrates on blood glucose levels  Demonstrates ability to create a balanced meal plan  Demonstrates carbohydrate counting   States when to check blood glucose levels  Demonstrates proper blood glucose monitoring techniques  States the effect of stress and exercise on blood glucose levels  States the importance of limiting caffeine and abstaining from alcohol and smoking  Plan:  Aim for 3 Carb Choices per meal (45 grams) +/- 1 either way  Aim for 1-2 Carbs per snack Begin reading food labels for Total Carbohydrate of foods If OK with your MD, consider  increasing your activity level by walking, Arm Chair Exercises or other activity daily as tolerated Begin checking BG before breakfast and 2 hours after first bite of breakfast, lunch and dinner as directed by MD  Bring Log Book/Sheet to every medical appointment  OR use Baby Scripts Baby Scripts:  Patient was introduced to [employer] and plans to use as record of  BG electronically  Take medication if directed by MD  Blood glucose monitor Rx called into pharmacy: Accu Check Guide with Fast Clix drums Patient instructed to test pre breakfast and 2 hours each meal as directed by MD  Patient  instructed to monitor glucose levels: FBS: 60 - 95 mg/dl 2 hour: <120 mg/dl  Patient received the following handouts:  Nutrition Diabetes and Pregnancy  Carbohydrate Counting List  BG Log Sheet  Patient will be seen for follow-up as needed.

## 2018-02-07 NOTE — Progress Notes (Signed)
   PRENATAL VISIT NOTE  Subjective:  Jill Ray is a 37 y.o. G4P3003 at [redacted]w[redacted]d being seen today for ongoing prenatal care.  She is currently monitored for the following issues for this high-risk pregnancy and has Diabetes mellitus complicating pregnancy, antepartum; Gestational diabetes; History of gestational diabetes; Encounter for supervision of normal first pregnancy in third trimester; AMA (advanced maternal age) multigravida 35+, second trimester; and Polyhydramnios affecting pregnancy in third trimester on their problem list.  Patient reports reflux. Denies leaking of fluid.   The following portions of the patient's history were reviewed and updated as appropriate: allergies, current medications, past family history, past medical history, past social history, past surgical history and problem list. Problem list updated.  Objective:  There were no vitals filed for this visit.  Fetal Status:           General:  Alert, oriented and cooperative. Patient is in no acute distress.  Skin: Skin is warm and dry. No rash noted.   Cardiovascular: Normal heart rate noted  Respiratory: Normal respiratory effort, no problems with respiration noted  Abdomen: Soft, gravid, appropriate for gestational age.        Pelvic: Cervical exam deferred        Extremities: Normal range of motion.     Mental Status: Normal mood and affect. Normal behavior. Normal judgment and thought content.   Assessment and Plan:  Pregnancy: G4P3003 at [redacted]w[redacted]d  1. Encounter for supervision of normal first pregnancy in third trimester  - CBC - Antibody screen; Future - HIV Antibody (routine testing w rflx) - RPR  2. Diabetes mellitus complicating pregnancy, antepartum Pt has appt today with diabetic nurse and educator  Pt reports a diet high in rice and Fufu. I have reviewed options for substitutions for Fufu.   3. AMA (advanced maternal age) multigravida 27+, second trimester  4. History of gestational  diabetes   5. Diet controlled gestational diabetes mellitus (GDM), antepartum  6. Polyhydramnios affecting pregnancy in third trimester S/p [place] Oct 2nd has a repeat scheduled for early Nov  Preterm labor symptoms and general obstetric precautions including but not limited to vaginal bleeding, contractions, leaking of fluid and fetal movement were reviewed in detail with the patient. Please refer to After Visit Summary for other counseling recommendations.  Return in about 2 weeks (around 02/21/2018).  Future Appointments  Date Time Provider [place]  02/07/2018  9:15 AM Lavonia Drafts, MD [facility]  02/07/2018 10:00 AM [place]  03/06/2018 11:00 AM [place] 3 WH-MFCUS MFC-US    Lavonia Drafts, MD

## 2018-02-07 NOTE — Patient Instructions (Signed)

## 2018-02-08 LAB — ANTIBODY SCREEN: ANTIBODY SCREEN: NEGATIVE

## 2018-02-08 LAB — CBC
HEMATOCRIT: 33.5 % — AB (ref 34.0–46.6)
Hemoglobin: 12 g/dL (ref 11.1–15.9)
MCH: 31.6 pg (ref 26.6–33.0)
MCHC: 35.8 g/dL — AB (ref 31.5–35.7)
MCV: 88 fL (ref 79–97)
PLATELETS: 197 10*3/uL (ref 150–450)
RBC: 3.8 x10E6/uL (ref 3.77–5.28)
RDW: 12.5 % (ref 12.3–15.4)
WBC: 7.1 10*3/uL (ref 3.4–10.8)

## 2018-02-10 ENCOUNTER — Encounter: Payer: Self-pay | Admitting: Obstetrics & Gynecology

## 2018-02-10 DIAGNOSIS — O99019 Anemia complicating pregnancy, unspecified trimester: Secondary | ICD-10-CM | POA: Insufficient documentation

## 2018-02-13 ENCOUNTER — Telehealth: Payer: Self-pay | Admitting: General Practice

## 2018-02-13 NOTE — Telephone Encounter (Signed)
Patient was signed up on babyscripts 10/8 and hasn't logged any blood sugars in. Called patient to inquire if she was having issues. It was very difficult to hear the patient on the phone. Patient states she is having problems finding the app on her phone. Patient states she downloaded it but cant seem to find it now. Offered to patient she can come by anytime during office hours for assistance. Patient states she will continue to write them down on her paper and will get help at her next appt. Patient had no questions

## 2018-02-13 NOTE — Telephone Encounter (Signed)
-----   Message from Lavonia Drafts, MD sent at 02/10/2018 11:41 PM EDT ----- Please call pt. She is anemic. She needs to begin FeSO4 daily. (OTC- she may need a Rx called in if her insurance covers this).   She should take this med on an empty stomach with 1/2 cup of Orange juice or Vit C. She may eat after 1/2 hour (34min).  Please warn her that her stools may turn dark and she may develop some constipation while on this medication.     Thx, clh-S

## 2018-02-13 NOTE — Telephone Encounter (Signed)
Called patient, no answer- left message on voicemail stating we are trying to reach you with results, please call us back.

## 2018-02-14 MED ORDER — FERROUS SULFATE 325 (65 FE) MG PO TABS: 325.0000 mg | ORAL_TABLET | Freq: Every day | ORAL | 3 refills | Status: DC

## 2018-02-14 NOTE — Telephone Encounter (Signed)
Notified pt of anemia, provider's recommendation, and that an Rx for iron has been sent to [facility] on [address].  I advised pt to take once a day.  Pt stated understanding.

## 2018-02-21 ENCOUNTER — Ambulatory Visit (INDEPENDENT_AMBULATORY_CARE_PROVIDER_SITE_OTHER): Payer: Medicaid Other | Admitting: Obstetrics & Gynecology

## 2018-02-21 VITALS — BP 110/67 | HR 112 | Wt 185.0 lb

## 2018-02-21 DIAGNOSIS — O403XX0 Polyhydramnios, third trimester, not applicable or unspecified: Secondary | ICD-10-CM

## 2018-02-21 DIAGNOSIS — Z3403 Encounter for supervision of normal first pregnancy, third trimester: Secondary | ICD-10-CM

## 2018-02-21 DIAGNOSIS — O24919 Unspecified diabetes mellitus in pregnancy, unspecified trimester: Secondary | ICD-10-CM

## 2018-02-21 DIAGNOSIS — O99019 Anemia complicating pregnancy, unspecified trimester: Secondary | ICD-10-CM

## 2018-02-21 DIAGNOSIS — O09522 Supervision of elderly multigravida, second trimester: Secondary | ICD-10-CM

## 2018-02-21 LAB — POCT URINALYSIS DIP (DEVICE)
Bilirubin Urine: NEGATIVE
Glucose, UA: NEGATIVE mg/dL
HGB URINE DIPSTICK: NEGATIVE
Ketones, ur: NEGATIVE mg/dL
LEUKOCYTES UA: NEGATIVE
NITRITE: NEGATIVE
PH: 7 (ref 5.0–8.0)
Protein, ur: NEGATIVE mg/dL
Specific Gravity, Urine: 1.02 (ref 1.005–1.030)
Urobilinogen, UA: 1 mg/dL (ref 0.0–1.0)

## 2018-02-21 NOTE — Progress Notes (Signed)
   PRENATAL VISIT NOTE  Subjective:  Jill Ray is a 37 y.o. G4P3003 at [redacted]w[redacted]d being seen today for ongoing prenatal care.  She is currently monitored for the following issues for this high-risk pregnancy and has Diabetes mellitus complicating pregnancy, antepartum; Gestational diabetes; History of gestational diabetes; Encounter for supervision of normal first pregnancy in third trimester; AMA (advanced maternal age) multigravida 35+, second trimester; Polyhydramnios affecting pregnancy in third trimester; and Anemia, antepartum on their problem list.  Patient reports no complaints.  Contractions: Irritability. Vag. Bleeding: None.  Movement: Present. Denies leaking of fluid.   The following portions of the patient's history were reviewed and updated as appropriate: allergies, current medications, past family history, past medical history, past social history, past surgical history and problem list. Problem list updated.  Objective:   Vitals:   02/21/18 0848  BP: 110/67  Pulse: (!) 112  Weight: 185 lb (83.9 kg)    Fetal Status: Fetal Heart Rate (bpm): 126   Movement: Present     General:  Alert, oriented and cooperative. Patient is in no acute distress.  Skin: Skin is warm and dry. No rash noted.   Cardiovascular: Normal heart rate noted  Respiratory: Normal respiratory effort, no problems with respiration noted  Abdomen: Soft, gravid, appropriate for gestational age.  Pain/Pressure: Present     Pelvic: Cervical exam deferred        Extremities: Normal range of motion.  Edema: None  Mental Status: Normal mood and affect. Normal behavior. Normal judgment and thought content.   Assessment and Plan:  Pregnancy: G4P3003 at [redacted]w[redacted]d  1. Encounter for supervision of normal pregnancy in third trimester  2. Polyhydramnios affecting pregnancy in third trimester Repeat US 03/06/2018  3. Diabetes mellitus complicating pregnancy, antepartum All am labs results are elevated. 109-112.  Only 2 of the postprandial results are abnormal   Metformin 500mg  q pm started Begin antenatal testing  4. AMA (advanced maternal age) multigravida 82+, second trimester  5. Anemia, antepartum encouraged FeSO4  Preterm labor symptoms and general obstetric precautions including but not limited to vaginal bleeding, contractions, leaking of fluid and fetal movement were reviewed in detail with the patient. Please refer to After Visit Summary for other counseling recommendations.  Return in about 2 weeks (around 03/07/2018).  Future Appointments  Date Time Provider [place]  03/06/2018 11:00 AM [facility]MFC [place] 3 WH-MFCUS MFC-US    Lavonia Drafts, MD

## 2018-02-21 NOTE — Patient Instructions (Signed)
High-Fiber Diet  Fiber, also called dietary fiber, is a type of carbohydrate found in fruits, vegetables, whole grains, and beans. A high-fiber diet can have many health benefits. Your health care provider may recommend a high-fiber diet to help:  · Prevent constipation. Fiber can make your bowel movements more regular.  · Lower your cholesterol.  · Relieve hemorrhoids, uncomplicated diverticulosis, or irritable bowel syndrome.  · Prevent overeating as part of a weight-loss plan.  · Prevent heart disease, type 2 diabetes, and certain cancers.    What is my plan?  The recommended daily intake of fiber includes:  · 38 grams for men under age 50.  · 30 grams for men over age 50.  · 25 grams for women under age 50.  · 21 grams for women over age 50.    You can get the recommended daily intake of dietary fiber by eating a variety of fruits, vegetables, grains, and beans. Your health care provider may also recommend a fiber supplement if it is not possible to get enough fiber through your diet.  What do I need to know about a high-fiber diet?  · Fiber supplements have not been widely studied for their effectiveness, so it is better to get fiber through food sources.  · Always check the fiber content on the nutrition facts label of any prepackaged food. Look for foods that contain at least 5 grams of fiber per serving.  · Ask your dietitian if you have questions about specific foods that are related to your condition, especially if those foods are not listed in the following section.  · Increase your daily fiber consumption gradually. Increasing your intake of dietary fiber too quickly may cause bloating, cramping, or gas.  · Drink plenty of water. Water helps you to digest fiber.  What foods can I eat?  Grains  Whole-grain breads. Multigrain cereal. Oats and oatmeal. Brown rice. Barley. Bulgur wheat. Millet. Bran muffins. Popcorn. Rye wafer crackers.  Vegetables   Sweet potatoes. Spinach. Kale. Artichokes. Cabbage. Broccoli. Green peas. Carrots. Squash.  Fruits  Berries. Pears. Apples. Oranges. Avocados. Prunes and raisins. Dried figs.  Meats and Other Protein Sources  [place]y, kidney, pinto, and soy beans. Split peas. Lentils. Nuts and seeds.  Dairy  Fiber-fortified yogurt.  Beverages  Fiber-fortified soy milk. Fiber-fortified orange juice.  Other  Fiber bars.  The items listed above may not be a complete list of recommended foods or beverages. Contact your dietitian for more options.  What foods are not recommended?  Grains  White bread. Pasta made with refined flour. White rice.  Vegetables  Fried potatoes. Canned vegetables. Well-cooked vegetables.  Fruits  Fruit juice. Cooked, strained fruit.  Meats and Other Protein Sources  Fatty cuts of meat. Fried poultry or fried fish.  Dairy  Milk. Yogurt. Cream cheese. Sour cream.  Beverages  Soft drinks.  Other  Cakes and pastries. Butter and oils.  The items listed above may not be a complete list of foods and beverages to avoid. Contact your dietitian for more information.  What are some tips for including high-fiber foods in my diet?  · Eat a wide variety of high-fiber foods.  · Make sure that half of all grains consumed each day are whole grains.  · Replace breads and cereals made from refined flour or white flour with whole-grain breads and cereals.  · Replace white rice with brown rice, bulgur wheat, or millet.  · Start the day with a breakfast that is high in fiber,   such as a cereal that contains at least 5 grams of fiber per serving.  · Use beans in place of meat in soups, salads, or pasta.  · Eat high-fiber snacks, such as berries, raw vegetables, nuts, or popcorn.  This information is not intended to replace advice given to you by your health care provider. Make sure you discuss any questions you have with your health care provider.  Document Released: 04/19/2005 Document Revised: 09/25/2015 Document Reviewed: 10/02/2013   Elsevier Interactive Patient Education © 2018 [employer]

## 2018-02-22 ENCOUNTER — Encounter: Payer: Self-pay | Admitting: Obstetrics and Gynecology

## 2018-02-28 ENCOUNTER — Ambulatory Visit: Payer: Self-pay

## 2018-02-28 ENCOUNTER — Ambulatory Visit (INDEPENDENT_AMBULATORY_CARE_PROVIDER_SITE_OTHER): Payer: Medicaid Other | Admitting: *Deleted

## 2018-02-28 ENCOUNTER — Ambulatory Visit (INDEPENDENT_AMBULATORY_CARE_PROVIDER_SITE_OTHER): Payer: Medicaid Other | Admitting: Obstetrics & Gynecology

## 2018-02-28 VITALS — BP 108/65 | HR 113 | Wt 187.1 lb

## 2018-02-28 DIAGNOSIS — O0993 Supervision of high risk pregnancy, unspecified, third trimester: Secondary | ICD-10-CM

## 2018-02-28 DIAGNOSIS — O24919 Unspecified diabetes mellitus in pregnancy, unspecified trimester: Secondary | ICD-10-CM

## 2018-02-28 DIAGNOSIS — O403XX0 Polyhydramnios, third trimester, not applicable or unspecified: Secondary | ICD-10-CM

## 2018-02-28 DIAGNOSIS — O09522 Supervision of elderly multigravida, second trimester: Secondary | ICD-10-CM

## 2018-02-28 DIAGNOSIS — Z3403 Encounter for supervision of normal first pregnancy, third trimester: Secondary | ICD-10-CM

## 2018-02-28 DIAGNOSIS — O09523 Supervision of elderly multigravida, third trimester: Secondary | ICD-10-CM

## 2018-02-28 DIAGNOSIS — O99019 Anemia complicating pregnancy, unspecified trimester: Secondary | ICD-10-CM

## 2018-02-28 DIAGNOSIS — Z8632 Personal history of gestational diabetes: Secondary | ICD-10-CM

## 2018-02-28 MED ORDER — METFORMIN HCL 500 MG PO TABS: 500.0000 mg | ORAL_TABLET | Freq: Every day | ORAL | 3 refills | Status: DC

## 2018-02-28 NOTE — Progress Notes (Addendum)
Pt informed that the ultrasound is considered a limited OB ultrasound and is not intended to be a complete ultrasound exam.  Patient also informed that the ultrasound is not being completed with the intent of assessing for fetal or placental anomalies or any pelvic abnormalities.  Explained that the purpose of today's ultrasound is to assess for presentation, BPP and amniotic fluid volume.  Patient acknowledges the purpose of the exam and the limitations of the study.    Attestation of Attending Supervision of RN: Evaluation and management procedures were performed by the nurse under my supervision and collaboration.  I have reviewed the nursing note and chart, and I agree with the management and plan.  Carolyn L. Harraway-Smith, M.D., FACOG   

## 2018-02-28 NOTE — Progress Notes (Signed)
Pt states she was told last week that she would be prescribed medication for blood sugar however her pharmacy has not received the prescription. [place] for growth scheduled on 11/4, BPP added. Pt states her glucose meter is not working properly - will have her meet with Diabetes Educator today.

## 2018-02-28 NOTE — Progress Notes (Signed)
   PRENATAL VISIT NOTE  Subjective:  Jill Ray is a 37 y.o. G4P3003 at [redacted]w[redacted]d being seen today for ongoing prenatal care.  She is currently monitored for the following issues for this high-risk pregnancy and has Diabetes mellitus complicating pregnancy, antepartum; Gestational diabetes; History of gestational diabetes; Encounter for supervision of normal first pregnancy in third trimester; AMA (advanced maternal age) multigravida 35+, second trimester; Polyhydramnios affecting pregnancy in third trimester; and Anemia, antepartum on their problem list.  Patient reports her meter is not working. She reports that it keeps saying the battery is low. She reports that her am glucose is still >100. She did not start the meds last week as there was no prescription when she went to the pharmacy. Contractions: Irritability. Vag. Bleeding: None.  Movement: Present. Denies leaking of fluid.   The following portions of the patient's history were reviewed and updated as appropriate: allergies, current medications, past family history, past medical history, past social history, past surgical history and problem list. Problem list updated.  Objective:   Vitals:   02/28/18 0841  BP: 108/65  Pulse: (!) 113  Weight: 187 lb 1.6 oz (84.9 kg)    Fetal Status: Fetal Heart Rate (bpm): NST   Movement: Present     General:  Alert, oriented and cooperative. Patient is in no acute distress.  Skin: Skin is warm and dry. No rash noted.   Cardiovascular: Normal heart rate noted  Respiratory: Normal respiratory effort, no problems with respiration noted  Abdomen: Soft, gravid, appropriate for gestational age.  Pain/Pressure: Present     Pelvic: Cervical exam deferred        Extremities: Normal range of motion.  Edema: None  Mental Status: Normal mood and affect. Normal behavior. Normal judgment and thought content.   Assessment and Plan:  Pregnancy: G4P3003 at [redacted]w[redacted]d  1. Supervision of high risk pregnancy in  third trimester NST reviewed and reactive.  2. Polyhydramnios affecting pregnancy in third trimester - [place] MFM FETAL BPP WO NON STRESS; Future  3. Diabetes mellitus complicating pregnancy, antepartum Begin Metformin 500mg  at night. Confirmed that order was NOT in frmo last week but, it is currently sent to the pharmacy.    - [place] MFM FETAL BPP WO NON STRESS; Future  4. Multigravida of advanced maternal age in third trimester  5. Encounter for supervision of normal first pregnancy in third trimester  Preterm labor symptoms and general obstetric precautions including but not limited to vaginal bleeding, contractions, leaking of fluid and fetal movement were reviewed in detail with the patient. Please refer to After Visit Summary for other counseling recommendations.  Return in about 2 weeks (around 03/14/2018) for NST and [facility] - has [place] @ 1100; 2 weeks for [facility] and NST/BPP.  Future Appointments  Date Time Provider [place]  03/06/2018 11:00 AM WH-MFC [place] 3 WH-MFCUS MFC-US    Lavonia Drafts, MD

## 2018-03-06 ENCOUNTER — Other Ambulatory Visit (HOSPITAL_COMMUNITY): Payer: Self-pay | Admitting: *Deleted

## 2018-03-06 ENCOUNTER — Ambulatory Visit (HOSPITAL_COMMUNITY)
Admission: RE | Admit: 2018-03-06 | Discharge: 2018-03-06 | Disposition: A | Discharge status: Home / Self Care | Payer: Medicaid Other | Source: Ambulatory Visit | Attending: Medical | Admitting: Medical

## 2018-03-06 ENCOUNTER — Ambulatory Visit (INDEPENDENT_AMBULATORY_CARE_PROVIDER_SITE_OTHER): Payer: Medicaid Other | Admitting: Family Medicine

## 2018-03-06 ENCOUNTER — Ambulatory Visit (INDEPENDENT_AMBULATORY_CARE_PROVIDER_SITE_OTHER): Payer: Medicaid Other | Admitting: *Deleted

## 2018-03-06 ENCOUNTER — Encounter (HOSPITAL_COMMUNITY): Payer: Self-pay

## 2018-03-06 VITALS — BP 110/64 | HR 107 | Wt 189.1 lb

## 2018-03-06 DIAGNOSIS — O24913 Unspecified diabetes mellitus in pregnancy, third trimester: Secondary | ICD-10-CM | POA: Insufficient documentation

## 2018-03-06 DIAGNOSIS — O24919 Unspecified diabetes mellitus in pregnancy, unspecified trimester: Secondary | ICD-10-CM

## 2018-03-06 DIAGNOSIS — O99213 Obesity complicating pregnancy, third trimester: Secondary | ICD-10-CM | POA: Diagnosis not present

## 2018-03-06 DIAGNOSIS — O403XX0 Polyhydramnios, third trimester, not applicable or unspecified: Secondary | ICD-10-CM | POA: Diagnosis not present

## 2018-03-06 DIAGNOSIS — O0993 Supervision of high risk pregnancy, unspecified, third trimester: Secondary | ICD-10-CM | POA: Diagnosis not present

## 2018-03-06 DIAGNOSIS — O09523 Supervision of elderly multigravida, third trimester: Secondary | ICD-10-CM | POA: Diagnosis not present

## 2018-03-06 DIAGNOSIS — O09293 Supervision of pregnancy with other poor reproductive or obstetric history, third trimester: Secondary | ICD-10-CM | POA: Diagnosis not present

## 2018-03-06 DIAGNOSIS — O24415 Gestational diabetes mellitus in pregnancy, controlled by oral hypoglycemic drugs: Secondary | ICD-10-CM

## 2018-03-06 DIAGNOSIS — O99019 Anemia complicating pregnancy, unspecified trimester: Secondary | ICD-10-CM

## 2018-03-06 DIAGNOSIS — Z3403 Encounter for supervision of normal first pregnancy, third trimester: Secondary | ICD-10-CM

## 2018-03-06 DIAGNOSIS — Z3A33 33 weeks gestation of pregnancy: Secondary | ICD-10-CM | POA: Insufficient documentation

## 2018-03-06 MED ORDER — ASPIRIN EC 81 MG PO TBEC: 81.0000 mg | DELAYED_RELEASE_TABLET | Freq: Every day | ORAL | 2 refills | Status: DC

## 2018-03-06 NOTE — Progress Notes (Signed)
Subjective:  Jill Ray is a 37 y.o. G4P3003 at [redacted]w[redacted]d being seen today for ongoing prenatal care.  She is currently monitored for the following issues for this high-risk pregnancy and has Diabetes mellitus complicating pregnancy, antepartum; Gestational diabetes; History of gestational diabetes; Encounter for supervision of normal first pregnancy in third trimester; AMA (advanced maternal age) multigravida 35+, second trimester; Polyhydramnios affecting pregnancy in third trimester; and Anemia, antepartum on their problem list.  GDM: Patient taking metformin 500mg  qhs.  Reports no hypoglycemic episodes.  Tolerating medication well Fasting: 91-114 (2 elevated) 2hr PP: 95-143 (5 elevated, but improving over course of week)  Patient reports no complaints.  Contractions: Irregular. Vag. Bleeding: None.  Movement: Present. Denies leaking of fluid.   The following portions of the patient's history were reviewed and updated as appropriate: allergies, current medications, past family history, past medical history, past social history, past surgical history and problem list. Problem list updated.  Objective:   Vitals:   03/06/18 0949  BP: 110/64  Pulse: (!) 107  Weight: 189 lb 1.6 oz (85.8 kg)    Fetal Status: Fetal Heart Rate (bpm): NST   Movement: Present     General:  Alert, oriented and cooperative. Patient is in no acute distress.  Skin: Skin is warm and dry. No rash noted.   Cardiovascular: Normal heart rate noted  Respiratory: Normal respiratory effort, no problems with respiration noted  Abdomen: Soft, gravid, appropriate for gestational age. Pain/Pressure: Present     Pelvic: Vag. Bleeding: None     Cervical exam deferred        Extremities: Normal range of motion.  Edema: None  Mental Status: Normal mood and affect. Normal behavior. Normal judgment and thought content.   Urinalysis:      Assessment and Plan:  Pregnancy: G4P3003 at [redacted]w[redacted]d  1. Supervision of high risk  pregnancy in third trimester FHT normal  2. Polyhydramnios affecting pregnancy in third trimester  3. Diabetes mellitus complicating pregnancy, antepartum Continue metformin Start ASA 81mg  NST reactive. BPP and [place] today  4. Multigravida of advanced maternal age in third trimester   Preterm labor symptoms and general obstetric precautions including but not limited to vaginal bleeding, contractions, leaking of fluid and fetal movement were reviewed in detail with the patient. Please refer to After Visit Summary for other counseling recommendations.  Return in about 8 days (around 03/14/2018) for as scheduled.   Truett Mainland, DO

## 2018-03-06 NOTE — Progress Notes (Signed)
[place] for growth and BPP @ MFM today 1100.

## 2018-03-14 ENCOUNTER — Encounter: Payer: Self-pay | Admitting: Advanced Practice Midwife

## 2018-03-14 ENCOUNTER — Ambulatory Visit (INDEPENDENT_AMBULATORY_CARE_PROVIDER_SITE_OTHER): Payer: Medicaid Other | Admitting: Advanced Practice Midwife

## 2018-03-14 ENCOUNTER — Ambulatory Visit (INDEPENDENT_AMBULATORY_CARE_PROVIDER_SITE_OTHER): Payer: Medicaid Other | Admitting: *Deleted

## 2018-03-14 ENCOUNTER — Ambulatory Visit: Payer: Self-pay

## 2018-03-14 VITALS — BP 108/72 | HR 99 | Wt 186.5 lb

## 2018-03-14 DIAGNOSIS — O403XX0 Polyhydramnios, third trimester, not applicable or unspecified: Secondary | ICD-10-CM | POA: Diagnosis present

## 2018-03-14 DIAGNOSIS — O24919 Unspecified diabetes mellitus in pregnancy, unspecified trimester: Secondary | ICD-10-CM

## 2018-03-14 DIAGNOSIS — Z3403 Encounter for supervision of normal first pregnancy, third trimester: Secondary | ICD-10-CM

## 2018-03-14 DIAGNOSIS — O09523 Supervision of elderly multigravida, third trimester: Secondary | ICD-10-CM

## 2018-03-14 DIAGNOSIS — O09522 Supervision of elderly multigravida, second trimester: Secondary | ICD-10-CM

## 2018-03-14 DIAGNOSIS — O0993 Supervision of high risk pregnancy, unspecified, third trimester: Secondary | ICD-10-CM

## 2018-03-14 MED ORDER — METFORMIN HCL 500 MG PO TABS: 1000.0000 mg | ORAL_TABLET | Freq: Every day | ORAL | 3 refills | Status: DC

## 2018-03-14 NOTE — Progress Notes (Deleted)
   PRENATAL VISIT NOTE  Subjective:  Jill Ray is a 37 y.o. G4P3003 at [redacted]w[redacted]d being seen today for ongoing prenatal care.  She is currently monitored for the following issues for this {Blank single:19197::"high-risk","low-risk"} pregnancy and has Diabetes mellitus complicating pregnancy, antepartum; Gestational diabetes; History of gestational diabetes; Encounter for supervision of normal first pregnancy in third trimester; AMA (advanced maternal age) multigravida 35+, second trimester; Polyhydramnios affecting pregnancy in third trimester; and Anemia, antepartum on their problem list.  Patient reports {sx:14538}.  Contractions: Irregular. Vag. Bleeding: None.  Movement: Present. Denies leaking of fluid.   The following portions of the patient's history were reviewed and updated as appropriate: allergies, current medications, past family history, past medical history, past social history, past surgical history and problem list. Problem list updated.  Objective:   Vitals:   03/14/18 0836  BP: 108/72  Pulse: 99  Weight: 186 lb 8 oz (84.6 kg)    Fetal Status: Fetal Heart Rate (bpm): NST   Movement: Present     General:  Alert, oriented and cooperative. Patient is in no acute distress.  Skin: Skin is warm and dry. No rash noted.   Cardiovascular: Normal heart rate noted  Respiratory: Normal respiratory effort, no problems with respiration noted  Abdomen: Soft, gravid, appropriate for gestational age.  Pain/Pressure: Present     Pelvic: {Blank single:19197::"Cervical exam performed","Cervical exam deferred"}        Extremities: Normal range of motion.  Edema: None  Mental Status: Normal mood and affect. Normal behavior. Normal judgment and thought content.   Assessment and Plan:  Pregnancy: G4P3003 at [redacted]w[redacted]d  1. Supervision of high risk pregnancy in third trimester ***  2. Diabetes mellitus complicating pregnancy, antepartum *** - [place] MFM FETAL BPP WO NON STRESS; Future  3.  Polyhydramnios affecting pregnancy in third trimester ***  4. Multigravida of advanced maternal age in third trimester *** - [place] MFM FETAL BPP WO NON STRESS; Future  5. Encounter for supervision of normal first pregnancy in third trimester ***  6. AMA (advanced maternal age) multigravida 35+, second trimester ***  {Blank single:19197::"Term","Preterm"} labor symptoms and general obstetric precautions including but not limited to vaginal bleeding, contractions, leaking of fluid and fetal movement were reviewed in detail with the patient. Please refer to After Visit Summary for other counseling recommendations.  Return in about 6 days (around 03/20/2018) for NST/BPP only,  2 weeks for NST/BPP and HOB.  Future Appointments  Date Time Provider [place]  04/04/2018 10:30 AM WH-MFC [place] 5 WH-MFCUS MFC-US    Nancy Arvin, [place]

## 2018-03-14 NOTE — Progress Notes (Signed)
Pt informed that the ultrasound is considered a limited OB ultrasound and is not intended to be a complete ultrasound exam.  Patient also informed that the ultrasound is not being completed with the intent of assessing for fetal or placental anomalies or any pelvic abnormalities.  Explained that the purpose of today's ultrasound is to assess for presentation, BPP and amniotic fluid volume.  Patient acknowledges the purpose of the exam and the limitations of the study.    Attestation of Attending Supervision of RN: Evaluation and management procedures were performed by the nurse under my supervision and collaboration.  I have reviewed the nursing note and chart, and I agree with the management and plan.  Carolyn L. Harraway-Smith, M.D., [place]lynn June

## 2018-03-14 NOTE — Progress Notes (Signed)
   PRENATAL VISIT NOTE  Subjective:  Jill Ray is a 37 y.o. G4P3003 at [redacted]w[redacted]d being seen today for ongoing prenatal care.  She is currently monitored for the following issues for this high-risk pregnancy and has Diabetes mellitus complicating pregnancy, antepartum; Gestational diabetes; History of gestational diabetes; Encounter for supervision of normal first pregnancy in third trimester; AMA (advanced maternal age) multigravida 35+, second trimester; Polyhydramnios affecting pregnancy in third trimester; and Anemia, antepartum on their problem list.  Patient reports no complaints.  Contractions: Irregular. Vag. Bleeding: None.  Movement: Present. Denies leaking of fluid.   The following portions of the patient's history were reviewed and updated as appropriate: allergies, current medications, past family history, past medical history, past social history, past surgical history and problem list. Problem list updated.  Objective:   Pt self-recorded blood glucose levels: -Fasting blood glucose: 6 out of 8 measurements above range -Post Breakfast: 2 out of 8 measurements above range -Post Lunch: 1 out of 8 measurements above range -Post Dinner:0 out of 8 measurements above range  Vitals:   03/14/18 0836  BP: 108/72  Pulse: 99  Weight: 186 lb 8 oz (84.6 kg)    Fetal Status: Fetal Heart Rate (bpm): NST   Movement: Present     General:  Alert, oriented and cooperative. Patient is in no acute distress.  Skin: Skin is warm and dry. No rash noted.   Cardiovascular: Normal heart rate noted  Respiratory: Normal respiratory effort, no problems with respiration noted  Abdomen: Soft, gravid, appropriate for gestational age.  Pain/Pressure: Present     Pelvic: Cervical exam not performed        Extremities: Normal range of motion.  Edema: None  Mental Status: Normal mood and affect. Normal behavior. Normal judgment and thought content.   Assessment and Plan:  Pregnancy: G4P3003 at  [redacted]w[redacted]d  1. Supervision of high risk pregnancy in third trimester -Reactive NST.  2. Diabetes mellitus complicating pregnancy, antepartum -Continue Metformin. Increase dose to two 500 mg tabs per day to better control fasting blood glucose levels.  - [place] MFM FETAL BPP WO NON STRESS; Future  3. Polyhydramnios affecting pregnancy in third trimester - Antenatal testing  4. Multigravida of advanced maternal age in third trimester - [place] MFM FETAL BPP WO NON STRESS; Future  5. Encounter for supervision of normal first pregnancy in third trimester   6. AMA (advanced maternal age) multigravida 37+, second trimester Antenatal testing   Preterm labor symptoms and general obstetric precautions including but not limited to vaginal bleeding, contractions, leaking of fluid and fetal movement were reviewed in detail with the patient. Please refer to After Visit Summary for other counseling recommendations.  Return in about 6 days (around 03/20/2018) for NST/BPP only,  2 weeks for NST/BPP and HOB.  Future Appointments  Date Time Provider [place]  04/04/2018 10:30 AM WH-MFC [place] 5 WH-MFCUS MFC-US    [place], [place]   I was present for the exam and agree with above.  Tamala Julian, [place], [place] 03/14/2018 11:00 AM

## 2018-03-14 NOTE — Patient Instructions (Signed)
Braxton Hicks Contractions °Contractions of the uterus can occur throughout pregnancy, but they are not always a sign that you are in labor. You may have practice contractions called Braxton Hicks contractions. These false labor contractions are sometimes confused with true labor. °What are Braxton Hicks contractions? °Braxton Hicks contractions are tightening movements that occur in the muscles of the uterus before labor. Unlike true labor contractions, these contractions do not result in opening (dilation) and thinning of the cervix. Toward the end of pregnancy (32-34 weeks), Braxton Hicks contractions can happen more often and may become stronger. These contractions are sometimes difficult to tell apart from true labor because they can be very uncomfortable. You should not feel embarrassed if you go to the hospital with false labor. °Sometimes, the only way to tell if you are in true labor is for your health care provider to look for changes in the cervix. The health care provider will do a physical exam and may monitor your contractions. If you are not in true labor, the exam should show that your cervix is not dilating and your water has not broken. °If there are other health problems associated with your pregnancy, it is completely safe for you to be sent home with false labor. You may continue to have Braxton Hicks contractions until you go into true labor. °How to tell the difference between true labor and false labor °True labor °· Contractions last 30-70 seconds. °· Contractions become very regular. °· Discomfort is usually felt in the top of the uterus, and it spreads to the lower abdomen and low back. °· Contractions do not go away with walking. °· Contractions usually become more intense and increase in frequency. °· The cervix dilates and gets thinner. °False labor °· Contractions are usually shorter and not as strong as true labor contractions. °· Contractions are usually irregular. °· Contractions  are often felt in the front of the lower abdomen and in the groin. °· Contractions may go away when you walk around or change positions while lying down. °· Contractions get weaker and are shorter-lasting as time goes on. °· The cervix usually does not dilate or become thin. °Follow these instructions at home: °· Take over-the-counter and prescription medicines only as told by your health care provider. °· Keep up with your usual exercises and follow other instructions from your health care provider. °· Eat and drink lightly if you think you are going into labor. °· If Braxton Hicks contractions are making you uncomfortable: °? Change your position from lying down or resting to walking, or change from walking to resting. °? Sit and rest in a tub of warm water. °? Drink enough fluid to keep your urine pale yellow. Dehydration may cause these contractions. °? Do slow and deep breathing several times an hour. °· Keep all follow-up prenatal visits as told by your health care provider. This is important. °Contact a health care provider if: °· You have a fever. °· You have continuous pain in your abdomen. °Get help right away if: °· Your contractions become stronger, more regular, and closer together. °· You have fluid leaking or gushing from your vagina. °· You pass blood-tinged mucus (bloody show). °· You have bleeding from your vagina. °· You have low back pain that you never had before. °· You feel your baby’s head pushing down and causing pelvic pressure. °· Your baby is not moving inside you as much as it used to. °Summary °· Contractions that occur before labor are called Braxton   Hicks contractions, false labor, or practice contractions. °· Braxton Hicks contractions are usually shorter, weaker, farther apart, and less regular than true labor contractions. True labor contractions usually become progressively stronger and regular and they become more frequent. °· Manage discomfort from Braxton Hicks contractions by  changing position, resting in a warm bath, drinking plenty of water, or practicing deep breathing. °This information is not intended to replace advice given to you by your health care provider. Make sure you discuss any questions you have with your health care provider. °Document Released: 09/02/2016 Document Revised: 09/02/2016 Document Reviewed: 09/02/2016 °Elsevier Interactive Patient Education © 2018 Elsevier Inc. ° °

## 2018-03-14 NOTE — Progress Notes (Signed)
[place] for growth scheduled on 12/3, BPP added

## 2018-03-20 ENCOUNTER — Ambulatory Visit (INDEPENDENT_AMBULATORY_CARE_PROVIDER_SITE_OTHER): Payer: Medicaid Other | Admitting: *Deleted

## 2018-03-20 ENCOUNTER — Ambulatory Visit: Payer: Self-pay

## 2018-03-20 VITALS — BP 122/76 | HR 104 | Wt 185.5 lb

## 2018-03-20 DIAGNOSIS — O24919 Unspecified diabetes mellitus in pregnancy, unspecified trimester: Secondary | ICD-10-CM

## 2018-03-20 DIAGNOSIS — O24913 Unspecified diabetes mellitus in pregnancy, third trimester: Secondary | ICD-10-CM

## 2018-03-20 NOTE — Progress Notes (Signed)

## 2018-03-27 ENCOUNTER — Other Ambulatory Visit (HOSPITAL_COMMUNITY)
Admission: RE | Admit: 2018-03-27 | Discharge: 2018-03-27 | Disposition: A | Discharge status: Home / Self Care | Payer: Medicaid Other | Source: Ambulatory Visit | Attending: Obstetrics & Gynecology | Admitting: Obstetrics & Gynecology

## 2018-03-27 ENCOUNTER — Ambulatory Visit (INDEPENDENT_AMBULATORY_CARE_PROVIDER_SITE_OTHER): Payer: Medicaid Other | Admitting: General Practice

## 2018-03-27 ENCOUNTER — Ambulatory Visit (INDEPENDENT_AMBULATORY_CARE_PROVIDER_SITE_OTHER): Payer: Medicaid Other | Admitting: Obstetrics & Gynecology

## 2018-03-27 ENCOUNTER — Ambulatory Visit: Payer: Self-pay

## 2018-03-27 VITALS — BP 121/59 | HR 107 | Wt 189.0 lb

## 2018-03-27 DIAGNOSIS — O24913 Unspecified diabetes mellitus in pregnancy, third trimester: Secondary | ICD-10-CM

## 2018-03-27 DIAGNOSIS — O24919 Unspecified diabetes mellitus in pregnancy, unspecified trimester: Secondary | ICD-10-CM

## 2018-03-27 DIAGNOSIS — O403XX0 Polyhydramnios, third trimester, not applicable or unspecified: Secondary | ICD-10-CM

## 2018-03-27 DIAGNOSIS — O09522 Supervision of elderly multigravida, second trimester: Secondary | ICD-10-CM

## 2018-03-27 DIAGNOSIS — Z3A36 36 weeks gestation of pregnancy: Secondary | ICD-10-CM

## 2018-03-27 DIAGNOSIS — O09523 Supervision of elderly multigravida, third trimester: Secondary | ICD-10-CM

## 2018-03-27 DIAGNOSIS — O0993 Supervision of high risk pregnancy, unspecified, third trimester: Secondary | ICD-10-CM

## 2018-03-27 DIAGNOSIS — O322XX0 Maternal care for transverse and oblique lie, not applicable or unspecified: Secondary | ICD-10-CM | POA: Insufficient documentation

## 2018-03-27 DIAGNOSIS — Z3403 Encounter for supervision of normal first pregnancy, third trimester: Secondary | ICD-10-CM | POA: Insufficient documentation

## 2018-03-27 LAB — OB RESULTS CONSOLE GC/CHLAMYDIA: Gonorrhea: NEGATIVE

## 2018-03-27 LAB — OB RESULTS CONSOLE GBS: GBS: NEGATIVE

## 2018-03-27 NOTE — Patient Instructions (Signed)
Breech Birth What is a breech birth? A breech birth is when a baby is born with the buttocks or the feet first. Most babies are in a head down (vertex) position when they are born. There are three types of breech babies:  When the baby's buttocks are showing first in the birth canal (vagina) with the legs straight up and the feet at the baby's head (frank breech).  When the baby's buttocks shows first with the legs bent at the knees and the feet down near the buttocks (complete breech).  When one or both of the baby's feet are down below the buttocks (footling breech).  What are the risks of a breech birth? Having a breech birth increases the risk to your baby. A breech birth may cause the following:  Umbilical cord prolapse. This is when the umbilical cord is in front of the baby before or during labor. This can cause the cord to become pinched or compressed. This can reduce the flow of blood and oxygen to the baby.  The baby getting stuck in the birth canal, which can cause injury or, rarely, death.  Injury to the nerves in the shoulder, arm, and hand (brachial plexus injury) when delivered.  Your baby being born too early (prematurely).  An increased need for a cesarean delivery.  What increases the risk of having a breech baby? It is not known what causes your baby to be breech. However, risk factors that may increase your chances of having a breech baby include the following:  The mother having had several babies already.  The mother having twins or more.  The mother having a baby with certain congenital disabilities.  The mother going into labor early.  The mother having problems with her uterus, such as a tumor.  The mother having placenta problems (placenta previa) or too much or not enough fluid surrounding the baby (amniotic fluid).  How do I know if my baby is breech? There are no symptoms for you to know that your baby is breech. When you are close to your due date,  your health care provider can tell if your baby is breech by:  An abdominal or vaginal (pelvic) exam.  An ultrasound.  Your health care provider may also be able to tell that your baby is breech if your baby's heartbeat is heard above your belly button. What can be done if my baby is breech?  Your health care provider may try to turn the baby in your uterus. This is a procedure called external cephalic version (ECV). This is done by your health care provider. He or she will place both hands on your abdomen and gently and slowly turn the baby around. It is important to know that ECV can increase your chances of suddenly going into labor. If an ECV is done, it is done toward the end of a healthy pregnancy. The baby may remain in this position or he or she may turn back to the breech position. You and your health care provider will discuss if an ECV is recommended for you and your baby. How will I delivery my baby if my baby is breech? You and your health care provider will discuss the best way to deliver your baby. If your baby is breech, it is less likely that a vaginal delivery will be recommended due to the risks. Some breech babies may be delivered safely without a cesarean, while in other cases health care providers will recommend a cesarean delivery. This  information is not intended to replace advice given to you by your health care provider. Make sure you discuss any questions you have with your health care provider. Document Released: 06/10/2006 Document Revised: 04/05/2016 Document Reviewed: 02/21/2014 Elsevier Interactive Patient Education  2017 [employer].

## 2018-03-27 NOTE — Progress Notes (Addendum)
   PRENATAL VISIT NOTE  Subjective:  Jill Ray is a 37 y.o. G4P3003 at [redacted]w[redacted]d being seen today for ongoing prenatal care.  She is currently monitored for the following issues for this high-risk pregnancy and has Diabetes mellitus complicating pregnancy, antepartum; Gestational diabetes; History of gestational diabetes; Supervision of high-risk pregnancy; AMA (advanced maternal age) multigravida 35+, second trimester; Polyhydramnios affecting pregnancy in third trimester; Anemia, antepartum; and Transverse presentation, antepartum on their problem list.  Patient reports no complaints.  Contractions: Irregular. Vag. Bleeding: None.  Movement: Present. Denies leaking of fluid.   The following portions of the patient's history were reviewed and updated as appropriate: allergies, current medications, past family history, past medical history, past social history, past surgical history and problem list. Problem list updated.  Objective:   Vitals:   03/27/18 0955  BP: (!) 121/59  Pulse: (!) 107  Weight: 189 lb (85.7 kg)    Fetal Status: Fetal Heart Rate (bpm): NST   Movement: Present     General:  Alert, oriented and cooperative. Patient is in no acute distress.  Skin: Skin is warm and dry. No rash noted.   Cardiovascular: Normal heart rate noted  Respiratory: Normal respiratory effort, no problems with respiration noted  Abdomen: Soft, gravid, appropriate for gestational age.  Pain/Pressure: Present     Pelvic: Cervical exam performed Dilation: 1 Effacement (%): Thick Station: Ballotable  Extremities: Normal range of motion.  Edema: None  Mental Status: Normal mood and affect. Normal behavior. Normal judgment and thought content.   Assessment and Plan:  Pregnancy: G4P3003 at [redacted]w[redacted]d  1. Diabetes mellitus complicating pregnancy, antepartum BS within range. Continue metformin. NST performed today was reviewed and was found to be reactive. Subsequent BPP performed today was also  reviewed and was found to be 10/10. AFI was also normal. Continue recommended antenatal testing and prenatal care.  2. Transverse presentation, antepartum, single or unspecified fetus Present since 34 weeks. Head to maternal right, back up.  Discussed external cephalic version vs cesarean section, she desires ECV but will have this scheduled after her next visit (if baby is still transverse).    3. AMA (advanced maternal age) multigravida 35+, second trimester 4. Supervision of high risk pregnancy in third trimester Pelvic cultures done today. - GC/Chlamydia probe amp ([place])not at [facility] - Culture, beta strep (group b only) Preterm labor symptoms and general obstetric precautions including but not limited to vaginal bleeding, contractions, leaking of fluid and fetal movement were reviewed in detail with the patient. Please refer to After Visit Summary for other counseling recommendations.  Return in about 1 week (around 04/03/2018) for NST, BPP, OB Visit (HOB).  Future Appointments  Date Time Provider [place]  04/03/2018  8:15 AM WOC-WOCA NST WOC-WOCA WOC  04/03/2018  9:15 AM Emily Filbert, MD WOC-WOCA WOC  04/10/2018  8:30 AM WH-MFC [place] 1 WH-MFCUS MFC-US  04/10/2018 10:15 AM WOC-WOCA NST WOC-WOCA WOC    Verita Schneiders, MD

## 2018-03-27 NOTE — Progress Notes (Signed)
Pt informed that the ultrasound is considered a limited OB ultrasound and is not intended to be a complete ultrasound exam.  Patient also informed that the ultrasound is not being completed with the intent of assessing for fetal or placental anomalies or any pelvic abnormalities.  Explained that the purpose of today's ultrasound is to assess for  BPP, presentation and AFI.  Patient acknowledges the purpose of the exam and the limitations of the study.    

## 2018-03-28 LAB — GC/CHLAMYDIA PROBE AMP (~~LOC~~) NOT AT ARMC
CHLAMYDIA, DNA PROBE: NEGATIVE
Neisseria Gonorrhea: NEGATIVE

## 2018-03-31 ENCOUNTER — Encounter: Payer: Self-pay | Admitting: Obstetrics & Gynecology

## 2018-03-31 LAB — CULTURE, BETA STREP (GROUP B ONLY): STREP GP B CULTURE: NEGATIVE

## 2018-04-03 ENCOUNTER — Telehealth (HOSPITAL_COMMUNITY): Payer: Self-pay | Admitting: *Deleted

## 2018-04-03 ENCOUNTER — Ambulatory Visit (INDEPENDENT_AMBULATORY_CARE_PROVIDER_SITE_OTHER): Payer: Medicaid Other | Admitting: General Practice

## 2018-04-03 ENCOUNTER — Encounter (HOSPITAL_COMMUNITY): Payer: Self-pay | Admitting: *Deleted

## 2018-04-03 ENCOUNTER — Ambulatory Visit (INDEPENDENT_AMBULATORY_CARE_PROVIDER_SITE_OTHER): Payer: Medicaid Other | Admitting: Obstetrics & Gynecology

## 2018-04-03 ENCOUNTER — Ambulatory Visit: Payer: Self-pay

## 2018-04-03 VITALS — BP 113/73 | HR 102 | Wt 186.0 lb

## 2018-04-03 DIAGNOSIS — O0993 Supervision of high risk pregnancy, unspecified, third trimester: Secondary | ICD-10-CM

## 2018-04-03 DIAGNOSIS — O24913 Unspecified diabetes mellitus in pregnancy, third trimester: Secondary | ICD-10-CM

## 2018-04-03 DIAGNOSIS — O09522 Supervision of elderly multigravida, second trimester: Secondary | ICD-10-CM

## 2018-04-03 DIAGNOSIS — O09523 Supervision of elderly multigravida, third trimester: Secondary | ICD-10-CM

## 2018-04-03 DIAGNOSIS — O24919 Unspecified diabetes mellitus in pregnancy, unspecified trimester: Secondary | ICD-10-CM

## 2018-04-03 DIAGNOSIS — O403XX0 Polyhydramnios, third trimester, not applicable or unspecified: Secondary | ICD-10-CM

## 2018-04-03 LAB — POCT URINALYSIS DIP (DEVICE)
Bilirubin Urine: NEGATIVE
Glucose, UA: NEGATIVE mg/dL
HGB URINE DIPSTICK: NEGATIVE
Ketones, ur: NEGATIVE mg/dL
LEUKOCYTES UA: NEGATIVE
NITRITE: NEGATIVE
Protein, ur: NEGATIVE mg/dL
Specific Gravity, Urine: 1.02 (ref 1.005–1.030)
UROBILINOGEN UA: 2 mg/dL — AB (ref 0.0–1.0)
pH: 6.5 (ref 5.0–8.0)

## 2018-04-03 NOTE — Progress Notes (Signed)
Pt informed that the ultrasound is considered a limited OB ultrasound and is not intended to be a complete ultrasound exam.  Patient also informed that the ultrasound is not being completed with the intent of assessing for fetal or placental anomalies or any pelvic abnormalities.  Explained that the purpose of today's ultrasound is to assess for  BPP, presentation and AFI.  Patient acknowledges the purpose of the exam and the limitations of the study.    Koren Bound RN BSN 04/03/18

## 2018-04-03 NOTE — Telephone Encounter (Signed)
Preadmission screen  

## 2018-04-03 NOTE — Progress Notes (Signed)
   PRENATAL VISIT NOTE  Subjective:  Jill Ray is a 37 y.o. G4P3003 at [redacted]w[redacted]d being seen today for ongoing prenatal care.  She is currently monitored for the following issues for this high-risk pregnancy and has Diabetes mellitus complicating pregnancy, antepartum; Gestational diabetes; History of gestational diabetes; Supervision of high-risk pregnancy; AMA (advanced maternal age) multigravida 35+, second trimester; Polyhydramnios affecting pregnancy in third trimester; Anemia, antepartum; and Transverse presentation, antepartum on their problem list.  Patient reports no complaints.  Contractions: Irregular. Vag. Bleeding: None.  Movement: Present. Denies leaking of fluid.   The following portions of the patient's history were reviewed and updated as appropriate: allergies, current medications, past family history, past medical history, past social history, past surgical history and problem list. Problem list updated.  Objective:   Vitals:   04/03/18 0847  BP: 113/73  Pulse: (!) 102  Weight: 186 lb (84.4 kg)    Fetal Status: Fetal Heart Rate (bpm): NST   Movement: Present     General:  Alert, oriented and cooperative. Patient is in no acute distress.  Skin: Skin is warm and dry. No rash noted.   Cardiovascular: Normal heart rate noted  Respiratory: Normal respiratory effort, no problems with respiration noted  Abdomen: Soft, gravid, appropriate for gestational age.  Pain/Pressure: Absent     Pelvic: Cervical exam deferred        Extremities: Normal range of motion.  Edema: None  Mental Status: Normal mood and affect. Normal behavior. Normal judgment and thought content.   Assessment and Plan:  Pregnancy: G4P3003 at [redacted]w[redacted]d  1. Supervision of high risk pregnancy in third trimester   2. AMA (advanced maternal age) multigravida 59+, second trimester   3. Diabetes mellitus complicating pregnancy, antepartum - She did not bring her sugars but tells me that they are in  range - weekly BPP  4. Polyhydramnios affecting pregnancy in third trimester - MFM follow up u/s 04-11-19  5. Oblique/transverse lie- she prefers an ECV - This will done tomorrow morning  Preterm labor symptoms and general obstetric precautions including but not limited to vaginal bleeding, contractions, leaking of fluid and fetal movement were reviewed in detail with the patient. Please refer to After Visit Summary for other counseling recommendations.  Return in about 1 week (around 04/10/2018) for [facility] appt.  Future Appointments  Date Time Provider [place]  04/04/2018  8:00 AM WH-BSSCHED ROOM WH-BSSCHED None  04/10/2018  8:30 AM WH-MFC [place] 1 WH-MFCUS MFC-US  04/10/2018 10:15 AM WOC-WOCA NST WOC-WOCA WOC    Ajamu Maxon Marijo Sanes, MD

## 2018-04-04 ENCOUNTER — Ambulatory Visit (HOSPITAL_COMMUNITY): Payer: Medicaid Other

## 2018-04-04 ENCOUNTER — Observation Stay (HOSPITAL_COMMUNITY)
Admission: RE | Admit: 2018-04-04 | Discharge: 2018-04-04 | Disposition: A | Discharge status: Home / Self Care | Payer: Medicaid Other | Source: Ambulatory Visit | Attending: Family Medicine | Admitting: Family Medicine

## 2018-04-04 ENCOUNTER — Encounter (HOSPITAL_COMMUNITY): Payer: Self-pay

## 2018-04-04 DIAGNOSIS — Z7982 Long term (current) use of aspirin: Secondary | ICD-10-CM | POA: Insufficient documentation

## 2018-04-04 DIAGNOSIS — O99019 Anemia complicating pregnancy, unspecified trimester: Secondary | ICD-10-CM

## 2018-04-04 DIAGNOSIS — Z881 Allergy status to other antibiotic agents status: Secondary | ICD-10-CM | POA: Diagnosis not present

## 2018-04-04 DIAGNOSIS — Z3A37 37 weeks gestation of pregnancy: Secondary | ICD-10-CM | POA: Insufficient documentation

## 2018-04-04 DIAGNOSIS — Z7984 Long term (current) use of oral hypoglycemic drugs: Secondary | ICD-10-CM | POA: Diagnosis not present

## 2018-04-04 DIAGNOSIS — Z79899 Other long term (current) drug therapy: Secondary | ICD-10-CM | POA: Insufficient documentation

## 2018-04-04 DIAGNOSIS — O321XX0 Maternal care for breech presentation, not applicable or unspecified: Secondary | ICD-10-CM | POA: Diagnosis not present

## 2018-04-04 DIAGNOSIS — O24415 Gestational diabetes mellitus in pregnancy, controlled by oral hypoglycemic drugs: Secondary | ICD-10-CM | POA: Diagnosis not present

## 2018-04-04 LAB — GLUCOSE, CAPILLARY: Glucose-Capillary: 97 mg/dL (ref 70–99)

## 2018-04-04 MED ORDER — TERBUTALINE SULFATE 1 MG/ML IJ SOLN
0.2500 mg | Freq: Once | INTRAMUSCULAR | Status: AC
  Administered 2018-04-04: 0.25 mg via SUBCUTANEOUS
  Filled 2018-04-04: qty 1

## 2018-04-04 NOTE — Procedures (Signed)
After informed verbal consent, Terbutaline 0.25 mg SQ given, ECV was attempted under Ultrasound guidance.  Baby initially transverse lie with head maternal left. Baby easily verted to cephalic position with one attempt. Discussed risk of transitioning to transverse or breech lie. Binder placed.   FHR was reactive before and after the procedure.   Pt. Tolerated the procedure well.  Truett Mainland, DO 04/04/2018, 9:38 AM

## 2018-04-04 NOTE — H&P (Signed)
Preprocedure History and Physical  Jill Ray is a 37 y.o. G4P3003 here for ECV due to breech presentation. Pregnancy complicated by GDM on medication.  No significant preoperative concerns.  Proposed procedure: ECV  Past Medical History:  Diagnosis Date  . Anemia   . Asthma   . Diabetes mellitus without complication (HCC)   . Gallstone pancreatitis 08/08/2012   /notes 08/08/2012  . Gallstone pancreatitis 08/14/2012  . Umbilical hernia    hernia (reducible umbilical hernia) is present. /notes 08/08/2012  . Urinary tract infection    GBS in urine current preg   Past Surgical History:  Procedure Laterality Date  . CHOLECYSTECTOMY N/A 08/11/2012   Procedure: LAPAROSCOPIC CHOLECYSTECTOMY;  Surgeon: Matthew Wakefield, MD;  Location: [facility] OR;  Service: General;  Laterality: N/A;  . ERCP N/A 08/13/2012   Procedure: ENDOSCOPIC RETROGRADE CHOLANGIOPANCREATOGRAPHY (ERCP);  Surgeon: Marc E Magod, MD;  Location: [facility] OR;  Service: Endoscopy;  Laterality: N/A;  . NO PAST SURGERIES    . SPHINCTEROTOMY  08/13/2012   Procedure: SPHINCTEROTOMY;  Surgeon: Marc E Magod, MD;  Location: [facility] OR;  Service: Endoscopy;;   OB History    Gravida  4   Para  3   Term  3   Preterm  0   AB  0   Living  3     SAB  0   TAB  0   Ectopic  0   Multiple  0   Live Births  3          Patient denies any cervical dysplasia or STIs. No current facility-administered medications on file prior to encounter.    Current Outpatient Medications on File Prior to Encounter  Medication Sig Dispense Refill  . ACCU-CHEK FASTCLIX LANCETS MISC 1 Device by Percutaneous route 4 (four) times daily. 100 each 12  . acetaminophen (TYLENOL) 500 MG tablet Take 1,000 mg by mouth every 6 (six) hours as needed for mild pain, moderate pain or headache.    . aspirin EC 81 MG tablet Take 1 tablet (81 mg total) by mouth daily. Take after 12 weeks for prevention of preeclampsia later in pregnancy 300 tablet 2  . Blood Glucose  Monitoring Suppl (ACCU-CHEK GUIDE) w/Device KIT 1 Device by Does not apply route QID. 1 kit 0  . ferrous sulfate 325 (65 FE) MG tablet Take 1 tablet (325 mg total) by mouth daily with breakfast. 30 tablet 3  . glucose blood test strip Use as instructed 100 each 12  . metFORMIN (GLUCOPHAGE) 500 MG tablet Take 2 tablets (1,000 mg total) by mouth at bedtime. 60 tablet 3  . Prenatal Multivit-Min-Fe-FA (PRENATAL VITAMINS) 0.8 MG tablet Take 1 tablet by mouth daily. 30 tablet 12  . ranitidine (ZANTAC) 150 MG tablet Take 1 tablet (150 mg total) by mouth 2 (two) times daily. 60 tablet 2   Allergies  Allergen Reactions  . Levofloxacin Itching   Social History:   reports that she has never smoked. She has never used smokeless tobacco. She reports that she does not drink alcohol or use drugs.  Family History  Problem Relation Age of Onset  . Asthma Maternal Grandfather   . Hypotension Mother   . Other Neg Hx     Review of Systems: Full 10 systems review of systems preformed, which were normal other than what was stated in the HPI.  PHYSICAL EXAM: Weight 85.8 kg, last menstrual period 07/16/2017, currently breastfeeding. General appearance - alert, well appearing, and in no distress Head -   Normocephalic, atraumatic.  Right and left external ears normal. Eyes - EOMI.  Nonicteric.  Normal conjunctiva Neck - supple, no lymphadenopathy.  No tracheal deviation Chest - clear to auscultation, no wheezes, rales or rhonchi, symmetric air entry Heart - normal rate and regular rhythm Abdomen - soft, nontender, nondistended, no masses or organomegaly Pelvic - examination not indicated Extremities - peripheral pulses normal, no pedal edema, no clubbing or cyanosis Skin - Warm to touch. no bruises, rashes, wounds. Neuro - Oriented x3.  Cranial nerves intact. Psych - normal thought process.  Judgement intact.  Labs: Results for orders placed or performed during the hospital encounter of 04/04/18 (from the  past 336 hour(s))  OB RESULT CONSOLE Group B Strep   Collection Time: 03/27/18 12:00 AM  Result Value Ref Range   GBS Negative   OB RESULTS CONSOLE GC/Chlamydia   Collection Time: 03/27/18 12:00 AM  Result Value Ref Range   Gonorrhea Negative   Glucose, capillary   Collection Time: 04/04/18  8:50 AM  Result Value Ref Range   Glucose-Capillary 97 70 - 99 mg/dL  Results for orders placed or performed in visit on 04/03/18 (from the past 336 hour(s))  POCT urinalysis dip (device)   Collection Time: 04/03/18  8:34 AM  Result Value Ref Range   Glucose, UA NEGATIVE NEGATIVE mg/dL   Bilirubin Urine NEGATIVE NEGATIVE   Ketones, ur NEGATIVE NEGATIVE mg/dL   Specific Gravity, Urine 1.020 1.005 - 1.030   Hgb urine dipstick NEGATIVE NEGATIVE   pH 6.5 5.0 - 8.0   Protein, ur NEGATIVE NEGATIVE mg/dL   Urobilinogen, UA 2.0 (H) 0.0 - 1.0 mg/dL   Nitrite NEGATIVE NEGATIVE   Leukocytes, UA NEGATIVE NEGATIVE  Results for orders placed or performed in visit on 03/27/18 (from the past 336 hour(s))  GC/Chlamydia probe amp ([place])not at [facility]   Collection Time: 03/27/18 12:00 AM  Result Value Ref Range   Chlamydia Negative    Neisseria gonorrhea Negative   Culture, beta strep (group b only)   Collection Time: 03/27/18 11:09 AM  Result Value Ref Range   Strep Gp B Culture Negative Negative    Imaging Studies: [place] Mfm Fetal Bpp Wo Non Stress  Result Date: 03/06/2018 ----------------------------------------------------------------------  OBSTETRICS REPORT                       (Signed Final 03/06/2018 12:34 pm) ---------------------------------------------------------------------- Patient Info  ID #:       [id number]                          D.O.B.:  April 15, 1981 (37 yrs)  Name:       Jill Ray               Visit Date: 03/06/2018 11:31 am ---------------------------------------------------------------------- Performed By  Performed By:     Rodrigo Ran BS      Ref. Address:      [facility]                    [facility]                                                              [facility]  [address]                                                              [address]                                                              [place], [place]                                                              [id number]  Attending:        Corenthian Booker      [facility]                    MD  Referred By:      [facility] ---------------------------------------------------------------------- Orders   #  Description                          Code         Ordered By   1  US MFM OB FOLLOW UP                  76816.01     CORENTHIAN                                                        BOOKER   2  US MFM FETAL BPP WO NON              76819.01     CAROLYN      STRESS                                            HARRAWAY-SMITH  ----------------------------------------------------------------------   #  Order #                    Accession #                 Episode #   1  [id number]                  [id number]                    [id number]   2  [id number]                  [id number]                  [id number]  ---------------------------------------------------------------------- Indications   Advanced maternal age multigravida 34+,        O67.523   third trimester (low risk NIPS)   Poor obstetric history: Previous gestational   O09.299   diabetes   Short interval between pregancies, 3rd         O09.893   trimester   Obesity complicating pregnancy, third          O99.213   trimester   Gestational diabetes in pregnancy,             O24.415   controlled by oral hypoglycemic drugs   (metformin)   [redacted] weeks gestation of pregnancy                Z3A.33  ---------------------------------------------------------------------- Vital Signs                                                  Height:        5'4" ---------------------------------------------------------------------- Fetal Evaluation  Num Of Fetuses:          1  Fetal Heart Rate(bpm):   143  Cardiac Activity:        Observed  Presentation:            Cephalic  Placenta:                Posterior  P. Cord Insertion:       Previously Visualized  Amniotic Fluid  AFI FV:      Within normal limits  AFI Sum(cm)     %Tile       Largest Pocket(cm)  20              75          7.19  RUQ(cm)       RLQ(cm)       LUQ(cm)        LLQ(cm)  5.67          7.19          2.9            4.24 ---------------------------------------------------------------------- Biophysical Evaluation  Amniotic F.V:   Within normal limits       F. Tone:         Observed  F. Movement:    Observed                   Score:           8/8  F. Breathing:   Observed ---------------------------------------------------------------------- Biometry  BPD:      86.8  mm     G. Age:  35w 0d         89  %    CI:        71.79   %    70 - 86  FL/HC:       20.5  %    19.9 - 21.5  HC:      326.1  mm     G. Age:  37w 0d         93  %    HC/AC:       1.01       0.96 - 1.11  AC:      322.2  mm     G. Age:  36w 1d       > 97  %    FL/BPD:      76.8  %    71 - 87  FL:       66.7  mm     G. Age:  34w 2d         67  %    FL/AC:       20.7  %    20 - 24  HUM:      57.3  mm     G. Age:  33w 2d         58  %  Est. FW:    2729   gm          6 lb     89  % ---------------------------------------------------------------------- OB History  Gravidity:    4         Term:   3  Living:       3 ---------------------------------------------------------------------- Gestational Age  LMP:           33w 2d        Date:  07/16/17                 EDD:   04/22/18  U/S Today:     35w 4d                                        EDD:   04/06/18  Best:          33w 2d     Det. By:  LMP  (07/16/17)          EDD:   04/22/18  ---------------------------------------------------------------------- Anatomy  Cranium:               Appears normal         LVOT:                   Appears normal  Cavum:                 Previously seen        Aortic Arch:            Previously seen  Ventricles:            Appears normal         Ductal Arch:            Previously seen  Choroid Plexus:        Appears normal         Diaphragm:              Appears normal  Cerebellum:            Appears normal         Stomach:                Appears normal, left  sided  Posterior Fossa:       Previously seen        Abdomen:                Appears normal  Nuchal Fold:           Not applicable (>51    Abdominal Wall:         Appears nml (cord                         wks GA)                                        insert, abd wall)  Face:                  Appears normal         Cord Vessels:           Appears normal (3                         (orbits and profile)                           vessel cord)  Lips:                  Appears normal         Kidneys:                Appear normal  Palate:                Not well visualized    Bladder:                Appears normal  Thoracic:              Appears normal         Spine:                  Previously seen  Heart:                 Previously seen        Upper Extremities:      Previously seen  RVOT:                  Appears normal         Lower Extremities:      Previously seen  Other:  Nasal bone visualized. Heels and 5th digit previously visualized.          Technically difficult due to fetal position and maternal habitus. ---------------------------------------------------------------------- Impression  Normal interval growth.  Biophysical profile 8/8 ---------------------------------------------------------------------- Recommendations  Continue serial growth and weekly testing. ----------------------------------------------------------------------                Sander Nephew, MD Electronically Signed Final Report   03/06/2018 12:34 pm ----------------------------------------------------------------------  [place] Mfm Ob Follow Up  Result Date: 03/06/2018 ----------------------------------------------------------------------  OBSTETRICS REPORT                       (Signed Final 03/06/2018 12:34 pm) ---------------------------------------------------------------------- Patient Info  ID #:       [id number]  D.O.B.:  02/02/1981 (37 yrs)  Name:       Jill Ray               Visit Date: 03/06/2018 11:31 am ---------------------------------------------------------------------- Performed By  Performed By:     Carrie Stalter BS      Ref. Address:      [facility]                    [facility]                                                              [facility]                                                              [address]                                                              [address]                                                              [place], [place]                                                              [id number]  Attending:        Corenthian Booker      [facility]                    MD  Referred By:      [facility]                    [facility] ---------------------------------------------------------------------- Orders   #  Description                          Code         Ordered By   1  US MFM OB FOLLOW UP                  76816.01     CORENTHIAN                                                          BOOKER   2  US MFM FETAL BPP WO NON              76819.01     CAROLYN      STRESS                                            HARRAWAY-SMITH  ----------------------------------------------------------------------   #  Order #                    Accession #                 Episode #   1  [id number]                   [id number]                  [id number]   2  [id number]                  [id number]                  [id number]  ---------------------------------------------------------------------- Indications   Advanced maternal age multigravida 35+,        O09.523   third trimester (low risk NIPS)   Poor obstetric history: Previous gestational   O09.299   diabetes   Short interval between pregancies, 3rd         O09.893   trimester   Obesity complicating pregnancy, third          O99.213   trimester   Gestational diabetes in pregnancy,             O24.415   controlled by oral hypoglycemic drugs   (metformin)   [redacted] weeks gestation of pregnancy                Z3A.33  ---------------------------------------------------------------------- Vital Signs                                                 Height:        5'4" ---------------------------------------------------------------------- Fetal Evaluation  Num Of Fetuses:          1  Fetal Heart Rate(bpm):   143  Cardiac Activity:        Observed  Presentation:            Cephalic  Placenta:                Posterior  P. Cord Insertion:       Previously Visualized  Amniotic Fluid  AFI FV:      Within normal limits  AFI Sum(cm)     %Tile       Largest Pocket(cm)  20              75          7.19  RUQ(cm)       RLQ(cm)       LUQ(cm)        LLQ(cm)  5.67          7.19          2.9            4.24 ---------------------------------------------------------------------- Biophysical Evaluation  Amniotic F.V:     Within normal limits       F. Tone:         Observed  F. Movement:    Observed                   Score:           8/8  F. Breathing:   Observed ---------------------------------------------------------------------- Biometry  BPD:      86.8  mm     G. Age:  35w 0d         89  %    CI:        71.79   %    70 - 86                                                          FL/HC:       20.5  %    19.9 - 21.5  HC:      326.1  mm     G. Age:  37w 0d         93  %    HC/AC:       1.01       0.96 -  1.11  AC:      322.2  mm     G. Age:  36w 1d       > 97  %    FL/BPD:      76.8  %    71 - 87  FL:       66.7  mm     G. Age:  34w 2d         67  %    FL/AC:       20.7  %    20 - 24  HUM:      57.3  mm     G. Age:  33w 2d         58  %  Est. FW:    2729   gm          6 lb     89  % ---------------------------------------------------------------------- OB History  Gravidity:    4         Term:   3  Living:       3 ---------------------------------------------------------------------- Gestational Age  LMP:           33w 2d        Date:  07/16/17                 EDD:   04/22/18  U/S Today:     35w 4d                                        EDD:   04/06/18  Best:          33w 2d     Det. By:  LMP  (07/16/17)          EDD:   04/22/18 ---------------------------------------------------------------------- Anatomy  Cranium:               Appears normal         LVOT:  Appears normal  Cavum:                 Previously seen        Aortic Arch:            Previously seen  Ventricles:            Appears normal         Ductal Arch:            Previously seen  Choroid Plexus:        Appears normal         Diaphragm:              Appears normal  Cerebellum:            Appears normal         Stomach:                Appears normal, left                                                                        sided  Posterior Fossa:       Previously seen        Abdomen:                Appears normal  Nuchal Fold:           Not applicable (>20    Abdominal Wall:         Appears nml (cord                         wks GA)                                        insert, abd wall)  Face:                  Appears normal         Cord Vessels:           Appears normal (3                         (orbits and profile)                           vessel cord)  Lips:                  Appears normal         Kidneys:                Appear normal  Palate:                Not well visualized    Bladder:                Appears normal   Thoracic:              Appears normal         Spine:                  Previously   seen  Heart:                 Previously seen        Upper Extremities:      Previously seen  RVOT:                  Appears normal         Lower Extremities:      Previously seen  Other:  Nasal bone visualized. Heels and 5th digit previously visualized.          Technically difficult due to fetal position and maternal habitus. ---------------------------------------------------------------------- Impression  Normal interval growth.  Biophysical profile 8/8 ---------------------------------------------------------------------- Recommendations  Continue serial growth and weekly testing. ----------------------------------------------------------------------               Sander Nephew, MD Electronically Signed Final Report   03/06/2018 12:34 pm ----------------------------------------------------------------------  US Fetal Bpp W/nonstress  Result Date: 03/29/2018 ----------------------------------------------------------------------  OBSTETRICS REPORT                       (Signed Final 03/29/2018 04:32 pm) ---------------------------------------------------------------------- Patient Info  ID #:       [id number]                          D.O.B.:  1980/10/09 (37 yrs)  Name:       Jill Ray               Visit Date: 03/27/2018 10:45 am ---------------------------------------------------------------------- Performed By  Performed By:     Derinda Late RN      Ref. Address:     [facility]                                                             [place], [place]  [place]  Attending:        Verita Schneiders         Location:          [facility] for                    MD                                       [facility]  Referred By:      [facility] for                    [place] ---------------------------------------------------------------------- Orders   #  Description                          Code         Ordered By   1  US FETAL BPP W/NONSTRESS             05397.6      Verita Schneiders  ----------------------------------------------------------------------   #  Order #                    Accession #                 Episode #   1  [id number]                  [id number]                  [id number]  ---------------------------------------------------------------------- Indications   [redacted] weeks gestation of pregnancy                Z3A.36   Pre-existing diabetes, type 2, in pregnancy,   O24.113   third trimester   Advanced maternal age multigravida 57+,        O41.523   third trimester  ---------------------------------------------------------------------- Vital Signs                                                 Height:        5'4" ---------------------------------------------------------------------- Fetal Evaluation  Num Of Fetuses:         1  Preg. Location:         Intrauterine  Cardiac Activity:       Observed  Fetal Lie:              Transverse  Presentation:           Transverse, head to maternal right  Amniotic Fluid  AFI FV:      Within normal limits  AFI Sum(cm)     %Tile  Largest Pocket(cm)  13.1            45          7.65  RUQ(cm)       RLQ(cm)       LUQ(cm)        LLQ(cm)  7.65          2.07          1.15           2.23 ---------------------------------------------------------------------- Biophysical Evaluation  Amniotic F.V:   Pocket => 2 cm two         F. Tone:        Observed                  planes  F. Movement:    Observed                   N.S.T:          Reactive  F. Breathing:   Observed                    Score:          10/10 ---------------------------------------------------------------------- OB History  Gravidity:    4         Term:   3  Living:       3 ---------------------------------------------------------------------- Gestational Age  LMP:           36w 2d        Date:  07/16/17                 EDD:   04/22/18  Best:          Harolyn Rutherford 2d     Det. By:  LMP  (07/16/17)          EDD:   04/22/18 ---------------------------------------------------------------------- Impression  Normal amniotic fluid volume. Antenatal testing is reassuring.  BPP 10/10. ---------------------------------------------------------------------- Recommendations  Continue weekly antenatal testing till delivery. ----------------------------------------------------------------------               Verita Schneiders, MD Electronically Signed Final Report   03/29/2018 04:32 pm ----------------------------------------------------------------------  US Fetal Bpp W/nonstress  Result Date: 03/27/2018 ----------------------------------------------------------------------  OBSTETRICS REPORT                       (Signed Final 03/27/2018 09:26 am) ---------------------------------------------------------------------- Patient Info  ID #:       [id number]                          D.O.B.:  30-Apr-1981 (37 yrs)  Name:       Jill Ray               Visit Date: 03/20/2018 10:16 am ---------------------------------------------------------------------- Performed By  Performed By:     Shauna Hugh Day RNC          Ref. Address:     [facility]  [place], [place]  Attending:        Emeterio Reeve MD        Location:          Center for                                                             [facility]  Referred By:      [facility] for                    [place] ---------------------------------------------------------------------- Orders   #  Description                          Code         Ordered By   1  US FETAL BPP W/NONSTRESS             98338.2      Emeterio Reeve  ----------------------------------------------------------------------   #  Order #                    Accession #                 Episode #   1  [id number]                  [id number]                  [id number]  ---------------------------------------------------------------------- Service(s) Provided   US Fetal BPP W NST  76818  ---------------------------------------------------------------------- Indications   [redacted] weeks gestation of pregnancy                Z3A.35   Pre-existing diabetes, type 2, in pregnancy,   O24.113   third trimester   Advanced maternal age multigravida 35+,        O09.523   third trimester  ---------------------------------------------------------------------- Vital Signs                                                 Height:        5'4" ---------------------------------------------------------------------- Fetal Evaluation  Num Of Fetuses:         1  Preg. Location:         Intrauterine  Cardiac Activity:       Observed  Presentation:           Transverse, head to maternal right  Amniotic Fluid  AFI FV:      Within normal limits  AFI Sum(cm)     %Tile       Largest Pocket(cm)  15.29           56          5.51  RUQ(cm)       RLQ(cm)       LUQ(cm)        LLQ(cm)  1.71          3.53          5.51           4.54 ---------------------------------------------------------------------- Biophysical Evaluation  Amniotic F.V:   Pocket => 2 cm two         F. Tone:         Observed                  planes  F. Movement:    Observed                   N.S.T:          Reactive  F. Breathing:   Not Observed               Score:          8/10 ---------------------------------------------------------------------- OB History  Gravidity:    4         Term:   3  Living:       3 ---------------------------------------------------------------------- Gestational Age  LMP:           35w 2d        Date:  07/16/17                 EDD:   04/22/18  Best:          35w 2d     Det. By:  LMP  (07/16/17)          EDD:   04/22/18 ---------------------------------------------------------------------- Impression  Normal amniotic fluid volume. BPP reassuring at 8/10.  Transverse fetal presentation. ---------------------------------------------------------------------- Recommendations  Continue weekly antenatal testing till delivery. ----------------------------------------------------------------------               Ugonna Anyanwu, MD Electronically Signed Final Report   03/27/2018 09:26 am ----------------------------------------------------------------------  Us Fetal Bpp W/nonstress  Result Date: 03/17/2018 ----------------------------------------------------------------------  OBSTETRICS REPORT                       (  Signed Final 03/17/2018 08:18 am) ---------------------------------------------------------------------- Patient Info  ID #:       [id number]                          D.O.B.:  1981/04/21 (37 yrs)  Name:       Jill Ray               Visit Date: 03/14/2018 09:03 am ---------------------------------------------------------------------- Performed By  Performed By:     Shauna Hugh Day RNC          Ref. Address:     [facility]                                                             [place]                                                             [place], [place]  Attending:        Wallie Renshaw-      [facility]         [facility]                    [facility] MD                                 [facility]  Referred By:      [facility]                    [facility] ---------------------------------------------------------------------- Orders   #  Description                          Code         Ordered By   1  US FETAL BPP W/NONSTRESS             76818.4      CAROLYN                                                        HARRAWAY-SMITH  ----------------------------------------------------------------------   #  Order #                    Accession #                 Episode #   1  [id number]                  [id number]                  [id number]  ---------------------------------------------------------------------- Service(s) Provided   US Fetal BPP W NST                                   76818  ---------------------------------------------------------------------- Indications   [redacted] weeks gestation of pregnancy                Z3A.34   Pre-existing diabetes, type 2, in pregnancy,   O24.113   third trimester   Advanced maternal age multigravida 35+,        O09.523   third trimester  ---------------------------------------------------------------------- Vital Signs                                                 Height:        5'4" ---------------------------------------------------------------------- Fetal Evaluation  Num Of Fetuses:         1  Preg. Location:         Intrauterine  Cardiac Activity:       Observed  Presentation:           Transverse, head to maternal left  Amniotic Fluid  AFI FV:      Within normal limits  AFI Sum(cm)     %Tile       Largest Pocket(cm)  17.43           64          6.44  RUQ(cm)       RLQ(cm)       LUQ(cm)        LLQ(cm)  6.44           3.64          2.66           4.69 ---------------------------------------------------------------------- Biophysical Evaluation  Amniotic F.V:   Pocket => 2 cm two         F. Tone:        Observed                    planes  F. Movement:    Observed                   N.S.T:          Reactive  F. Breathing:   Observed                   Score:          10/10 ---------------------------------------------------------------------- OB History  Gravidity:    4         Term:   3  Living:       3 ---------------------------------------------------------------------- Gestational Age  LMP:           34w 3d        Date:  07/16/17                 EDD:   04/22/18  Best:          34w 3d     Det. By:  LMP  (07/16/17)          EDD:   04/22/18 ---------------------------------------------------------------------- Impression  Normal amniotic fluid volume. Antenatal testing is reassuring  with BPP 10/10. ---------------------------------------------------------------------- Recommendations  Continue weekly antenatal testing till delivery. ----------------------------------------------------------------------              Ugonna Anyanwu, MD Electronically Signed Final Report   03/17/2018 08:18 am ----------------------------------------------------------------------   Assessment: Patient Active Problem List   Diagnosis Date Noted  . Transverse presentation, antepartum 03/27/2018  . Anemia, antepartum 02/10/2018  . Diabetes mellitus complicating pregnancy, antepartum 01/27/2018  . Polyhydramnios affecting pregnancy in third trimester 01/04/2018  . History of gestational diabetes 12/27/2017  . Supervision of high-risk pregnancy 12/27/2017  . AMA (advanced maternal age) multigravida 35+, second trimester 12/27/2017  . Gestational diabetes 07/10/2016    Plan: Patient will undergo surgical management with ECV. Discussed risks of fetal intolerance, fetal bradycardia, pain, placenta abruption, spontaneous rupture of  membranes, need for emergent c/s. Patient NPO since last night.  Stinson, Jacob J, DO  04/04/2018, 9:03 AM     

## 2018-04-04 NOTE — Progress Notes (Signed)
I have reviewed this chart and agree with the RN/CMA assessment and management.    Azlyn Wingler C Damika Harmon, MD, FACOG Attending Physician, Faculty Practice [facility]  

## 2018-04-04 NOTE — Discharge Instructions (Signed)
Pain Relief During Labor and Delivery Many things can cause pain during labor and delivery, including:  Pressure on bones and ligaments due to the baby moving through the pelvis.  Stretching of tissues due to the baby moving through the birth canal.  Muscle tension due to anxiety or nervousness.  The uterus tightening (contracting) and relaxing to help move the baby.  There are many ways to deal with the pain of labor and delivery. They include:  Taking prenatal classes. Taking these classes helps you know what to expect during your babys birth. What you learn will increase your confidence and decrease your anxiety.  Practicing relaxation techniques or doing relaxing activities, such as: ? Focused breathing. ? Meditation. ? Visualization. ? Aroma therapy. ? Listening to your favorite music. ? Hypnosis.  Taking a warm shower or bath (hydrotherapy). This may: ? Provide comfort and relaxation. ? Lessen your perception of pain. ? Decrease the amount of pain medicine needed. ? Decrease the length of labor.  Getting a massage or counterpressure on your back.  Applying warm packs or ice packs.  Changing positions often, moving around, or using a birthing ball.  Getting: ? Pain medicine through an IV or injection into a muscle. ? Pain medicine inserted into your spinal column. ? Injections of sterile water just under the skin on your lower back (intradermal injections). ? Laughing gas (nitrous oxide).  Discuss your pain control options with your health care provider during your prenatal visits. Explore the options offered by your hospital or birth center. What kinds of medicine are available? There are two kinds of medicines that can be used to relieve pain during labor and delivery:  Analgesics. These medicines decrease pain without causing you to lose feeling or the ability to move your muscles.  Anesthetics. These medicines block feeling in the body and can decrease your  ability to move freely.  Both of these kinds of medicine can cause minor side effects, such as nausea, trouble concentrating, and sleepiness. They can also decrease the baby's heart rate before birth and affect the babys breathing rate after birth. For this reason, health care providers are careful about when and how much medicine is given. What are specific medicines and procedures that provide pain relief? Local Anesthetics Local anesthetics are used to numb a small area of the body. They may be used along with another kind of anesthetic or used to numb the nerves of the vagina, cervix, and perineum during the second stage of labor. General Anesthetics General anesthetics cause you to lose consciousness so you do not feel pain. They are usually only used for an emergency cesarean delivery. General anesthetics are given through an IV tube and a mask. Pudendal Block A pudendal block is a form of local anesthetic. It may be used to relieve the pain associated with pushing or stretching of the perineum at the time of delivery or to further numb the perineum. A pudendal block is done by injecting numbing medicine through the vaginal wall into a nerve in the pelvis. Epidural Analgesia Epidural analgesia is given through a flexible IV catheter that is inserted into the lower back. Numbing medicine is delivered continuously to the area near your spinal column nerves (epidural space). After having this type of analgesia, you may be able to move your legs but you most likely will not be able to walk. Depending on the amount of medicine given, you may lose all feeling in the lower half of your body, or you may  retain some level of sensation, including the urge to push. Epidural analgesia can be used to provide pain relief for a vaginal birth. Spinal Block A spinal block is similar to epidural analgesia, but the medicine is injected into the spinal fluid instead of the epidural space. A spinal block is only  given once. It starts to relieve pain quickly, but the pain relief lasts only 1-6 hours. Spinal blocks can be used for cesarean deliveries. Combined Spinal-Epidural (CSE) Block A CSE block combines the effects of a spinal block and epidural analgesia. The spinal block works quickly to block all pain. The epidural analgesia provides continuous pain relief, even after the effects of the spinal block have worn off. This information is not intended to replace advice given to you by your health care provider. Make sure you discuss any questions you have with your health care provider. Document Released: 08/05/2008 Document Revised: 09/26/2015 Document Reviewed: 09/10/2015 Elsevier Interactive Patient Education  2018 [employer]. [employer] of the uterus can occur throughout pregnancy, but they are not always a sign that you are in labor. You may have practice contractions called Braxton Hicks contractions. These false labor contractions are sometimes confused with true labor. What are [place] contractions? Braxton Hicks contractions are tightening movements that occur in the muscles of the uterus before labor. Unlike true labor contractions, these contractions do not result in opening (dilation) and thinning of the cervix. Toward the end of pregnancy (32-34 weeks), Braxton Hicks contractions can happen more often and may become stronger. These contractions are sometimes difficult to tell apart from true labor because they can be very uncomfortable. You should not feel embarrassed if you go to the hospital with false labor. Sometimes, the only way to tell if you are in true labor is for your health care provider to look for changes in the cervix. The health care provider will do a physical exam and may monitor your contractions. If you are not in true labor, the exam should show that your cervix is not dilating and your water has not broken. If there are other health  problems associated with your pregnancy, it is completely safe for you to be sent home with false labor. You may continue to have Braxton Hicks contractions until you go into true labor. How to tell the difference between true labor and false labor True labor  Contractions last 30-70 seconds.  Contractions become very regular.  Discomfort is usually felt in the top of the uterus, and it spreads to the lower abdomen and low back.  Contractions do not go away with walking.  Contractions usually become more intense and increase in frequency.  The cervix dilates and gets thinner. False labor  Contractions are usually shorter and not as strong as true labor contractions.  Contractions are usually irregular.  Contractions are often felt in the front of the lower abdomen and in the groin.  Contractions may go away when you walk around or change positions while lying down.  Contractions get weaker and are shorter-lasting as time goes on.  The cervix usually does not dilate or become thin. Follow these instructions at home:  Take over-the-counter and prescription medicines only as told by your health care provider.  Keep up with your usual exercises and follow other instructions from your health care provider.  Eat and drink lightly if you think you are going into labor.  If Braxton Hicks contractions are making you uncomfortable: ? Change your position from lying  down or resting to walking, or change from walking to resting. ? Sit and rest in a tub of warm water. ? Drink enough fluid to keep your urine pale yellow. Dehydration may cause these contractions. ? Do slow and deep breathing several times an hour.  Keep all follow-up prenatal visits as told by your health care provider. This is important. Contact a health care provider if:  You have a fever.  You have continuous pain in your abdomen. Get help right away if:  Your contractions become stronger, more regular, and  closer together.  You have fluid leaking or gushing from your vagina.  You pass blood-tinged mucus (bloody show).  You have bleeding from your vagina.  You have low back pain that you never had before.  You feel your babys head pushing down and causing pelvic pressure.  Your baby is not moving inside you as much as it used to. Summary  Contractions that occur before labor are called Braxton Hicks contractions, false labor, or practice contractions.  Braxton Hicks contractions are usually shorter, weaker, farther apart, and less regular than true labor contractions. True labor contractions usually become progressively stronger and regular and they become more frequent.  Manage discomfort from [facility] contractions by changing position, resting in a warm bath, drinking plenty of water, or practicing deep breathing. This information is not intended to replace advice given to you by your health care provider. Make sure you discuss any questions you have with your health care provider. Document Released: 09/02/2016 Document Revised: 09/02/2016 Document Reviewed: 09/02/2016 Elsevier Interactive Patient Education  2018 [employer].

## 2018-04-10 ENCOUNTER — Encounter (HOSPITAL_COMMUNITY): Payer: Self-pay

## 2018-04-10 ENCOUNTER — Ambulatory Visit (INDEPENDENT_AMBULATORY_CARE_PROVIDER_SITE_OTHER): Payer: Medicaid Other | Admitting: General Practice

## 2018-04-10 ENCOUNTER — Ambulatory Visit (HOSPITAL_COMMUNITY)
Admission: RE | Admit: 2018-04-10 | Discharge: 2018-04-10 | Disposition: A | Discharge status: Home / Self Care | Payer: Medicaid Other | Source: Ambulatory Visit | Attending: Obstetrics and Gynecology | Admitting: Obstetrics and Gynecology

## 2018-04-10 ENCOUNTER — Telehealth (HOSPITAL_COMMUNITY): Payer: Self-pay | Admitting: *Deleted

## 2018-04-10 ENCOUNTER — Ambulatory Visit (INDEPENDENT_AMBULATORY_CARE_PROVIDER_SITE_OTHER): Payer: Medicaid Other | Admitting: Obstetrics & Gynecology

## 2018-04-10 ENCOUNTER — Encounter (HOSPITAL_COMMUNITY): Payer: Self-pay | Admitting: *Deleted

## 2018-04-10 VITALS — BP 107/67 | HR 102 | Wt 186.0 lb

## 2018-04-10 DIAGNOSIS — Z3A38 38 weeks gestation of pregnancy: Secondary | ICD-10-CM | POA: Diagnosis not present

## 2018-04-10 DIAGNOSIS — O09523 Supervision of elderly multigravida, third trimester: Secondary | ICD-10-CM | POA: Diagnosis not present

## 2018-04-10 DIAGNOSIS — O09522 Supervision of elderly multigravida, second trimester: Secondary | ICD-10-CM

## 2018-04-10 DIAGNOSIS — O0993 Supervision of high risk pregnancy, unspecified, third trimester: Secondary | ICD-10-CM

## 2018-04-10 DIAGNOSIS — O24919 Unspecified diabetes mellitus in pregnancy, unspecified trimester: Secondary | ICD-10-CM

## 2018-04-10 DIAGNOSIS — O24415 Gestational diabetes mellitus in pregnancy, controlled by oral hypoglycemic drugs: Secondary | ICD-10-CM | POA: Diagnosis not present

## 2018-04-10 DIAGNOSIS — O99213 Obesity complicating pregnancy, third trimester: Secondary | ICD-10-CM | POA: Diagnosis not present

## 2018-04-10 DIAGNOSIS — O24913 Unspecified diabetes mellitus in pregnancy, third trimester: Secondary | ICD-10-CM

## 2018-04-10 DIAGNOSIS — O09293 Supervision of pregnancy with other poor reproductive or obstetric history, third trimester: Secondary | ICD-10-CM

## 2018-04-10 DIAGNOSIS — O24113 Pre-existing diabetes mellitus, type 2, in pregnancy, third trimester: Secondary | ICD-10-CM

## 2018-04-10 HISTORY — DX: Gestational diabetes mellitus in pregnancy, unspecified control: O24.419

## 2018-04-10 LAB — POCT URINALYSIS DIP (DEVICE)
Bilirubin Urine: NEGATIVE
Glucose, UA: NEGATIVE mg/dL
Hgb urine dipstick: NEGATIVE
Ketones, ur: NEGATIVE mg/dL
Leukocytes, UA: NEGATIVE
Nitrite: NEGATIVE
PROTEIN: NEGATIVE mg/dL
SPECIFIC GRAVITY, URINE: 1.015 (ref 1.005–1.030)
Urobilinogen, UA: 1 mg/dL (ref 0.0–1.0)
pH: 6 (ref 5.0–8.0)

## 2018-04-10 NOTE — Progress Notes (Signed)
NST performed in office today & was found to be reactive. Patient will be seen by Dr Hulan Fray today for [facility] visit.  Koren Bound RN BSN 04/10/18

## 2018-04-10 NOTE — Telephone Encounter (Signed)
Preadmission screen  

## 2018-04-10 NOTE — Progress Notes (Signed)
Induction scheduled for 04/17/18 @ 0700

## 2018-04-10 NOTE — Progress Notes (Signed)
   PRENATAL VISIT NOTE  Subjective:  Jill Ray is a 37 y.o. [phone] at [redacted]w[redacted]d being seen today for ongoing prenatal care.  She is currently monitored for the following issues for this high-risk pregnancy and has Diabetes mellitus complicating pregnancy, antepartum; Gestational diabetes; History of gestational diabetes; Supervision of high-risk pregnancy; AMA (advanced maternal age) multigravida 35+, second trimester; Polyhydramnios affecting pregnancy in third trimester; and Anemia, antepartum on their problem list.  Patient reports no complaints.  Contractions: Irregular. Vag. Bleeding: None.  Movement: Present. Denies leaking of fluid.   The following portions of the patient's history were reviewed and updated as appropriate: allergies, current medications, past family history, past medical history, past social history, past surgical history and problem list. Problem list updated.  Objective:   Vitals:   04/10/18 0929  BP: 107/67  Pulse: (!) 102  Weight: 186 lb (84.4 kg)    Fetal Status: Fetal Heart Rate (bpm): NST   Movement: Present     General:  Alert, oriented and cooperative. Patient is in no acute distress.  Skin: Skin is warm and dry. No rash noted.   Cardiovascular: Normal heart rate noted  Respiratory: Normal respiratory effort, no problems with respiration noted  Abdomen: Soft, gravid, appropriate for gestational age.  Pain/Pressure: Absent     Pelvic: Cervical exam performed        Extremities: Normal range of motion.  Edema: None  Mental Status: Normal mood and affect. Normal behavior. Normal judgment and thought content.   Assessment and Plan:  Pregnancy: G4P3003 at [redacted]w[redacted]d  1. Diabetes mellitus complicating pregnancy, antepartum -IOL at 39 weeks  2. Supervision of high risk pregnancy in third trimester  3. AMA (advanced maternal age) multigravida 81+, second trimester - low risk NIPS  Term labor symptoms and general obstetric precautions including but not  limited to vaginal bleeding, contractions, leaking of fluid and fetal movement were reviewed in detail with the patient. Please refer to After Visit Summary for other counseling recommendations.  No follow-ups on file.  Future Appointments  Date Time Provider [place]  04/10/2018 11:15 AM Emily Filbert, MD [place]  04/17/2018  7:00 AM [facility]-BSSCHED ROOM WH-BSSCHED None    Emily Filbert, MD

## 2018-04-12 NOTE — Progress Notes (Signed)
I have reviewed this chart and agree with the RN/CMA assessment and management.    Ollie Delano C Darielle Hancher, MD, FACOG Attending Physician, Faculty Practice [facility]  

## 2018-04-14 ENCOUNTER — Other Ambulatory Visit: Payer: Self-pay | Admitting: Advanced Practice Midwife

## 2018-04-17 ENCOUNTER — Inpatient Hospital Stay (HOSPITAL_COMMUNITY)
Admission: RE | Admit: 2018-04-17 | Discharge: 2018-04-19 | DRG: 807 | Disposition: A | Discharge status: Home / Self Care | Payer: Medicaid Other | Attending: Obstetrics and Gynecology | Admitting: Obstetrics and Gynecology

## 2018-04-17 ENCOUNTER — Encounter (HOSPITAL_COMMUNITY): Payer: Self-pay

## 2018-04-17 ENCOUNTER — Inpatient Hospital Stay (HOSPITAL_COMMUNITY): Payer: Medicaid Other | Admitting: Anesthesiology

## 2018-04-17 DIAGNOSIS — Z3A39 39 weeks gestation of pregnancy: Secondary | ICD-10-CM

## 2018-04-17 DIAGNOSIS — O322XX0 Maternal care for transverse and oblique lie, not applicable or unspecified: Secondary | ICD-10-CM | POA: Diagnosis present

## 2018-04-17 DIAGNOSIS — O3663X0 Maternal care for excessive fetal growth, third trimester, not applicable or unspecified: Secondary | ICD-10-CM | POA: Diagnosis present

## 2018-04-17 DIAGNOSIS — O403XX0 Polyhydramnios, third trimester, not applicable or unspecified: Secondary | ICD-10-CM | POA: Diagnosis not present

## 2018-04-17 DIAGNOSIS — O24425 Gestational diabetes mellitus in childbirth, controlled by oral hypoglycemic drugs: Principal | ICD-10-CM | POA: Diagnosis present

## 2018-04-17 DIAGNOSIS — O9902 Anemia complicating childbirth: Secondary | ICD-10-CM | POA: Diagnosis present

## 2018-04-17 DIAGNOSIS — D649 Anemia, unspecified: Secondary | ICD-10-CM | POA: Diagnosis present

## 2018-04-17 DIAGNOSIS — O164 Unspecified maternal hypertension, complicating childbirth: Secondary | ICD-10-CM | POA: Diagnosis present

## 2018-04-17 DIAGNOSIS — O24429 Gestational diabetes mellitus in childbirth, unspecified control: Secondary | ICD-10-CM | POA: Diagnosis not present

## 2018-04-17 DIAGNOSIS — O24419 Gestational diabetes mellitus in pregnancy, unspecified control: Secondary | ICD-10-CM | POA: Diagnosis present

## 2018-04-17 DIAGNOSIS — O24420 Gestational diabetes mellitus in childbirth, diet controlled: Secondary | ICD-10-CM | POA: Diagnosis not present

## 2018-04-17 LAB — CBC
HCT: 38.4 % (ref 36.0–46.0)
Hemoglobin: 13 g/dL (ref 12.0–15.0)
MCH: 32.2 pg (ref 26.0–34.0)
MCHC: 33.9 g/dL (ref 30.0–36.0)
MCV: 95 fL (ref 80.0–100.0)
Platelets: 154 10*3/uL (ref 150–400)
RBC: 4.04 MIL/uL (ref 3.87–5.11)
RDW: 13.7 % (ref 11.5–15.5)
WBC: 8.1 10*3/uL (ref 4.0–10.5)
nRBC: 0 % (ref 0.0–0.2)

## 2018-04-17 LAB — TYPE AND SCREEN
ABO/RH(D): O POS
Antibody Screen: NEGATIVE

## 2018-04-17 LAB — GLUCOSE, RANDOM: Glucose, Bld: 112 mg/dL — ABNORMAL HIGH (ref 70–99)

## 2018-04-17 LAB — RPR: RPR: NONREACTIVE

## 2018-04-17 MED ORDER — PENICILLIN G 3 MILLION UNITS IVPB - SIMPLE MED
3.0000 10*6.[IU] | INTRAVENOUS | Status: DC
  Filled 2018-04-17 (×2): qty 100

## 2018-04-17 MED ORDER — SODIUM CHLORIDE 0.9 % IV SOLN
5.0000 10*6.[IU] | Freq: Once | INTRAVENOUS | Status: AC
  Administered 2018-04-17: 5 10*6.[IU] via INTRAVENOUS
  Filled 2018-04-17: qty 5

## 2018-04-17 MED ORDER — DIPHENHYDRAMINE HCL 25 MG PO CAPS: 25.0000 mg | ORAL_CAPSULE | Freq: Four times a day (QID) | ORAL | Status: DC | PRN

## 2018-04-17 MED ORDER — PHENYLEPHRINE 40 MCG/ML (10ML) SYRINGE FOR IV PUSH (FOR BLOOD PRESSURE SUPPORT)
80.0000 ug | PREFILLED_SYRINGE | INTRAVENOUS | Status: DC | PRN
  Filled 2018-04-17: qty 10

## 2018-04-17 MED ORDER — LACTATED RINGERS IV SOLN: 500.0000 mL | Freq: Once | INTRAVENOUS | Status: DC

## 2018-04-17 MED ORDER — ACETAMINOPHEN 325 MG PO TABS: 650.0000 mg | ORAL_TABLET | ORAL | Status: DC | PRN

## 2018-04-17 MED ORDER — ONDANSETRON HCL 4 MG/2ML IJ SOLN: 4.0000 mg | INTRAMUSCULAR | Status: DC | PRN

## 2018-04-17 MED ORDER — EPHEDRINE 5 MG/ML INJ
10.0000 mg | INTRAVENOUS | Status: DC | PRN
  Filled 2018-04-17: qty 2

## 2018-04-17 MED ORDER — TETANUS-DIPHTH-ACELL PERTUSSIS 5-2.5-18.5 LF-MCG/0.5 IM SUSP: 0.5000 mL | Freq: Once | INTRAMUSCULAR | Status: DC

## 2018-04-17 MED ORDER — PHENYLEPHRINE 40 MCG/ML (10ML) SYRINGE FOR IV PUSH (FOR BLOOD PRESSURE SUPPORT)
80.0000 ug | PREFILLED_SYRINGE | INTRAVENOUS | Status: DC | PRN
  Filled 2018-04-17 (×2): qty 10

## 2018-04-17 MED ORDER — SENNOSIDES-DOCUSATE SODIUM 8.6-50 MG PO TABS
2.0000 | ORAL_TABLET | ORAL | Status: DC
  Administered 2018-04-17 – 2018-04-18 (×2): 2 via ORAL
  Filled 2018-04-17 (×2): qty 2

## 2018-04-17 MED ORDER — OXYCODONE-ACETAMINOPHEN 5-325 MG PO TABS: 1.0000 | ORAL_TABLET | ORAL | Status: DC | PRN

## 2018-04-17 MED ORDER — SOD CITRATE-CITRIC ACID 500-334 MG/5ML PO SOLN: 30.0000 mL | ORAL | Status: DC | PRN

## 2018-04-17 MED ORDER — SIMETHICONE 80 MG PO CHEW: 80.0000 mg | CHEWABLE_TABLET | ORAL | Status: DC | PRN

## 2018-04-17 MED ORDER — ONDANSETRON HCL 4 MG/2ML IJ SOLN: 4.0000 mg | Freq: Four times a day (QID) | INTRAMUSCULAR | Status: DC | PRN

## 2018-04-17 MED ORDER — PRENATAL MULTIVITAMIN CH
1.0000 | ORAL_TABLET | Freq: Every day | ORAL | Status: DC
  Administered 2018-04-18 – 2018-04-19 (×2): 1 via ORAL
  Filled 2018-04-17 (×2): qty 1

## 2018-04-17 MED ORDER — FENTANYL CITRATE (PF) 100 MCG/2ML IJ SOLN
100.0000 ug | INTRAMUSCULAR | Status: DC | PRN
  Administered 2018-04-17: 100 ug via INTRAVENOUS
  Filled 2018-04-17: qty 2

## 2018-04-17 MED ORDER — OXYTOCIN BOLUS FROM INFUSION
500.0000 mL | Freq: Once | INTRAVENOUS | Status: AC
  Administered 2018-04-17: 500 mL via INTRAVENOUS

## 2018-04-17 MED ORDER — LIDOCAINE HCL (PF) 1 % IJ SOLN
INTRAMUSCULAR | Status: DC | PRN
  Administered 2018-04-17 (×2): 5 mL via EPIDURAL

## 2018-04-17 MED ORDER — WITCH HAZEL-GLYCERIN EX PADS: 1.0000 "application " | MEDICATED_PAD | CUTANEOUS | Status: DC | PRN

## 2018-04-17 MED ORDER — OXYTOCIN 40 UNITS IN LACTATED RINGERS INFUSION - SIMPLE MED
2.5000 [IU]/h | INTRAVENOUS | Status: DC
  Administered 2018-04-17: 2.5 [IU]/h via INTRAVENOUS
  Filled 2018-04-17: qty 1000

## 2018-04-17 MED ORDER — IBUPROFEN 600 MG PO TABS
600.0000 mg | ORAL_TABLET | Freq: Four times a day (QID) | ORAL | Status: DC
  Administered 2018-04-17 – 2018-04-19 (×8): 600 mg via ORAL
  Filled 2018-04-17 (×8): qty 1

## 2018-04-17 MED ORDER — LIDOCAINE HCL (PF) 1 % IJ SOLN
30.0000 mL | INTRAMUSCULAR | Status: DC | PRN
  Filled 2018-04-17: qty 30

## 2018-04-17 MED ORDER — LACTATED RINGERS IV SOLN
INTRAVENOUS | Status: DC
  Administered 2018-04-17 (×2): via INTRAVENOUS

## 2018-04-17 MED ORDER — DIPHENHYDRAMINE HCL 50 MG/ML IJ SOLN: 12.5000 mg | INTRAMUSCULAR | Status: DC | PRN

## 2018-04-17 MED ORDER — FENTANYL 2.5 MCG/ML BUPIVACAINE 1/10 % EPIDURAL INFUSION (WH - ANES)
14.0000 mL/h | INTRAMUSCULAR | Status: DC | PRN
  Administered 2018-04-17: 14 mL/h via EPIDURAL
  Filled 2018-04-17: qty 100

## 2018-04-17 MED ORDER — BENZOCAINE-MENTHOL 20-0.5 % EX AERO
1.0000 "application " | INHALATION_SPRAY | CUTANEOUS | Status: DC | PRN
  Administered 2018-04-17: 1 via TOPICAL
  Filled 2018-04-17: qty 56

## 2018-04-17 MED ORDER — COCONUT OIL OIL: 1.0000 "application " | TOPICAL_OIL | Status: DC | PRN

## 2018-04-17 MED ORDER — TERBUTALINE SULFATE 1 MG/ML IJ SOLN
0.2500 mg | Freq: Once | INTRAMUSCULAR | Status: DC | PRN
  Filled 2018-04-17: qty 1

## 2018-04-17 MED ORDER — ONDANSETRON HCL 4 MG PO TABS: 4.0000 mg | ORAL_TABLET | ORAL | Status: DC | PRN

## 2018-04-17 MED ORDER — DIBUCAINE 1 % RE OINT: 1.0000 "application " | TOPICAL_OINTMENT | RECTAL | Status: DC | PRN

## 2018-04-17 MED ORDER — OXYCODONE-ACETAMINOPHEN 5-325 MG PO TABS: 2.0000 | ORAL_TABLET | ORAL | Status: DC | PRN

## 2018-04-17 MED ORDER — LACTATED RINGERS IV SOLN
500.0000 mL | INTRAVENOUS | Status: DC | PRN
  Administered 2018-04-17: 1000 mL via INTRAVENOUS

## 2018-04-17 MED ORDER — OXYTOCIN 40 UNITS IN LACTATED RINGERS INFUSION - SIMPLE MED: 1.0000 m[IU]/min | INTRAVENOUS | Status: DC

## 2018-04-17 NOTE — Anesthesia Pain Management Evaluation Note (Signed)
  CRNA Pain Management Visit Note  Patient: Jill Ray, 37 y.o., female  "Hello I am a member of the anesthesia team at [facility]. We have an anesthesia team available at all times to provide care throughout the hospital, including epidural management and anesthesia for C-section. I don't know your plan for the delivery whether it a natural birth, water birth, IV sedation, nitrous supplementation, doula or epidural, but we want to meet your pain goals."   1.Was your pain managed to your expectations on prior hospitalizations?   Yes   2.What is your expectation for pain management during this hospitalization?     Epidural  3.How can we help you reach that goal? Support PRN  Record the patient's initial score and the patient's pain goal.   Pain: 6  Pain Goal: unknown goal per patient   The [facility] wants you to be able to say your pain was always managed very well.  Kathie Rhodes 04/17/2018

## 2018-04-17 NOTE — Anesthesia Preprocedure Evaluation (Signed)
Anesthesia Evaluation  Patient identified by MRN, date of birth, ID band Patient awake    Reviewed: Allergy & Precautions, H&P , NPO status , Patient's Chart, lab work & pertinent test results  History of Anesthesia Complications Negative for: history of anesthetic complications  Airway Mallampati: II  TM Distance: >3 FB Neck ROM: full    Dental no notable dental hx. (+) Teeth Intact   Pulmonary asthma ,    Pulmonary exam normal breath sounds clear to auscultation       Cardiovascular negative cardio ROS Normal cardiovascular exam Rhythm:regular Rate:Normal     Neuro/Psych negative neurological ROS  negative psych ROS   GI/Hepatic negative GI ROS, Neg liver ROS,   Endo/Other  diabetes, Gestational  Renal/GU negative Renal ROS  negative genitourinary   Musculoskeletal   Abdominal   Peds  Hematology negative hematology ROS (+)   Anesthesia Other Findings   Reproductive/Obstetrics (+) Pregnancy                             Anesthesia Physical Anesthesia Plan  ASA: II  Anesthesia Plan: Epidural   Post-op Pain Management:    Induction:   PONV Risk Score and Plan:   Airway Management Planned:   Additional Equipment:   Intra-op Plan:   Post-operative Plan:   Informed Consent: I have reviewed the patients History and Physical, chart, labs and discussed the procedure including the risks, benefits and alternatives for the proposed anesthesia with the patient or authorized representative who has indicated his/her understanding and acceptance.     Plan Discussed with:   Anesthesia Plan Comments:         Anesthesia Quick Evaluation

## 2018-04-17 NOTE — Lactation Note (Signed)
This note was copied from a baby's chart. Lactation Consultation Note  Patient Name: Jill Ray [id number] Date: 04/17/2018 Reason for consult: Initial assessment;Term P4, 9 hour female infant. Mom is experienced at breastfeeding she breastfeed other two  children for one year. Mom is confident in her breastfeeding abilities. Per mom, she had breastfeed infant for  30 minutes prior to [facility] entering the room.LC did not observe latch at this time. Parents felt mom did not have milk. [place] ask mom to hand express and colostrum present in  both breast. Per mom, she  is active on the  [facility] program  in [place]. Mom given hand pump "harmony" and explained how to use,  mom doesn't have a breast pump at home. LC discussed I & O. Mom know to breastfeed according hunger cues, 8 to 12 times within 24 hours including nights and not to go past 3 hours without BF infant. Mom made aware of O/P services, breastfeeding support groups, community resources, and our phone # for post-discharge questions.     Maternal Data Formula Feeding for Exclusion: No Has patient been taught Hand Expression?: Yes Does the patient have breastfeeding experience prior to this delivery?: Yes  Feeding Feeding Type: Breast Fed  LATCH Score Latch: Too sleepy or reluctant, no latch achieved, no sucking elicited.  Audible Swallowing: None  Type of Nipple: Everted at rest and after stimulation  Comfort (Breast/Nipple): Soft / non-tender  Hold (Positioning): No assistance needed to correctly position infant at breast.  LATCH Score: 6  Interventions Interventions: Breast feeding basics reviewed;Hand express;Breast massage;Hand pump  Lactation Tools Discussed/Used WIC Program: Yes Pump Review: Setup, frequency, and cleaning Initiated by:: Vicente Serene, IBCLC Date initiated:: 04/17/18   Consult Status Consult Status: Follow-up Date: 04/18/18 Follow-up type: In-patient    Vicente Serene 04/17/2018,  11:30 PM

## 2018-04-17 NOTE — Anesthesia Procedure Notes (Signed)
Epidural Patient location during procedure: OB  Staffing Anesthesiologist: Tela Kotecki, MD Performed: anesthesiologist   Preanesthetic Checklist Completed: patient identified, site marked, surgical consent, pre-op evaluation, timeout performed, IV checked, risks and benefits discussed and monitors and equipment checked  Epidural Patient position: sitting Prep: DuraPrep Patient monitoring: heart rate, continuous pulse ox and blood pressure Approach: right paramedian Location: L3-L4 Injection technique: LOR saline  Needle:  Needle type: Tuohy  Needle gauge: 17 G Needle length: 9 cm and 9 Needle insertion depth: 7 cm Catheter type: closed end flexible Catheter size: 20 Guage Catheter at skin depth: 11 cm Test dose: negative  Assessment Events: blood not aspirated, injection not painful, no injection resistance, negative IV test and no paresthesia  Additional Notes Patient identified. Risks/Benefits/Options discussed with patient including but not limited to bleeding, infection, nerve damage, paralysis, failed block, incomplete pain control, headache, blood pressure changes, nausea, vomiting, reactions to medication both or allergic, itching and postpartum back pain. Confirmed with bedside nurse the patient's most recent platelet count. Confirmed with patient that they are not currently taking any anticoagulation, have any bleeding history or any family history of bleeding disorders. Patient expressed understanding and wished to proceed. All questions were answered. Sterile technique was used throughout the entire procedure. Please see nursing notes for vital signs. Test dose was given through epidural needle and negative prior to continuing to dose epidural or start infusion. Warning signs of high block given to the patient including shortness of breath, tingling/numbness in hands, complete motor block, or any concerning symptoms with instructions to call for help. Patient was given  instructions on fall risk and not to get out of bed. All questions and concerns addressed with instructions to call with any issues.     

## 2018-04-17 NOTE — H&P (Addendum)
LABOR AND DELIVERY ADMISSION HISTORY AND PHYSICAL NOTE  Jill Ray is a 37 y.o. female [phone] with IUP at 46w2dby LMP presenting for IOL 2/2 complications of GDM, mild polyhydramnios, AMA, anemia.  She reports positive fetal movement. She denies leakage of fluid or vaginal bleeding. Reports ctx q6-10 min since last night.  [facility]:  - [id number]- mild polyhydramnios - AMA - anemia - malpresentation > successful ECV - LGA 4011 gm '@38w'   Past Medical History: Past Medical History:  Diagnosis Date  . Anemia    takes Iron supplements  . Asthma    dust, exhaustion, exercise trigger attacks; pt cannot remember when she last used inhaler  . [facility]   . Gallstone pancreatitis 08/08/2012   /[facility]o4/12/2012  . Gallstone pancreatitis 08/14/2012  . Gestational diabetes    Metformin  . Umbilical hernia    hernia (reducible umbilical hernia) is present. /[facility]4/12/2012  . Urinary tract infection    GBS in urine current preg    Past Surgical History: Past Surgical History:  Procedure Laterality Date  . CHOLECYSTECTOMY N/A 08/11/2012   Procedure: LAPAROSCOPIC CHOLECYSTECTOMY;  Surgeon: MRolm Bookbinder MD;  [facility]  Service: General;  Laterality: N/A;  . ERCP N/A 08/13/2012   Procedure: ENDOSCOPIC RETROGRADE CHOLANGIOPANCREATOGRAPHY (ERCP);  Surgeon: MJeryl Columbia MD;  [facility]  Service: Endoscopy;  Laterality: N/A;  . NO PAST SURGERIES    . SPHINCTEROTOMY  08/13/2012   Procedure: SPHINCTEROTOMY;  Surgeon: MJeryl Columbia MD;  [facility];  Service: Endoscopy;;    Obstetrical History: OB History    Gravida  4   Para  3   Term  3   Preterm  0   AB  0   Living  3     SAB  0   TAB  0   Ectopic  0   Multiple  0   Live Births  3           Social History: Social History   Socioeconomic History  . Marital status: Married    Spouse name: Not on  file  . Number of children: Not on file  . Years of education: Not on file  . Highest education level: Not on file  Occupational History  . Not on file  Social Needs  . Financial resource strain: Not on file  . Food insecurity:    Worry: Not on file    Inability: Not on file  . Transportation needs:    Medical: Not on file    Non-medical: Not on file  Tobacco Use  . Smoking status: Never Smoker  . Smokeless tobacco: Never Used  Substance and Sexual Activity  . Alcohol use: No  . Drug use: No  . Sexual activity: Not Currently  Lifestyle  . Physical activity:    Days per week: Not on file    Minutes per session: Not on file  . Stress: Not on file  Relationships  . Social connections:    Talks on phone: Not on file    Gets together: Not on file    Attends religious service: Not on file    Active member of club or organization: Not on file    Attends meetings of clubs or organizations: Not on file    Relationship status: Not on file  Other Topics Concern  . Not on file  Social History Narrative  . Not on  file    Family History: Family History  Problem Relation Age of Onset  . Asthma Maternal Grandfather   . Hypotension Mother   . Other Neg Hx     Allergies: Allergies  Allergen Reactions  . Levofloxacin Itching    Medications Prior to Admission  Medication Sig Dispense Refill Last Dose  . ACCU-CHEK FASTCLIX LANCETS MISC 1 Device by Percutaneous route 4 (four) times daily. 100 each 12 Taking  . acetaminophen (TYLENOL) 500 MG tablet Take 1,000 mg by mouth every 6 (six) hours as needed for mild pain, moderate pain or headache.   Taking  . aspirin EC 81 MG tablet Take 1 tablet (81 mg total) by mouth daily. Take after 12 weeks for prevention of preeclampsia later in pregnancy 300 tablet 2 Taking  . Blood Glucose Monitoring Suppl (ACCU-CHEK GUIDE) w/Device KIT 1 Device by Does not apply route QID. 1 kit 0 Taking  . ferrous sulfate 325 (65 FE) MG tablet Take 1 tablet  (325 mg total) by mouth daily with breakfast. 30 tablet 3 Taking  . glucose blood test strip Use as instructed 100 each 12 Taking  . metFORMIN (GLUCOPHAGE) 500 MG tablet Take 2 tablets (1,000 mg total) by mouth at bedtime. 60 tablet 3 Taking  . Prenatal Multivit-Min-Fe-FA (PRENATAL VITAMINS) 0.8 MG tablet Take 1 tablet by mouth daily. 30 tablet 12 Taking  . ranitidine (ZANTAC) 150 MG tablet Take 1 tablet (150 mg total) by mouth 2 (two) times daily. 60 tablet 2 Taking     Review of Systems  All systems reviewed and negative except as stated in HPI  Physical Exam Blood pressure 127/86, pulse (!) 106, temperature 98.4 F (36.9 C), temperature source Oral, resp. rate 20, height '5\' 4"'  (1.626 m), weight 86.8 kg, last menstrual period 07/16/2017, currently breastfeeding. General appearance: alert, oriented, NAD Lungs: normal respiratory effort Heart: regular rate Abdomen: soft, non-tender; gravid, FH appropriate for GA Extremities: No calf swelling or tenderness Presentation: cephalic Fetal monitoring: category I Uterine activity: occasional    Prenatal labs: ABO, Rh: O/Positive/-- (08/27 0926) Antibody: Negative (10/08 0932) Rubella: 27.40 (08/27 0926) RPR: Non Reactive (08/27 0926)  HBsAg: Negative (08/27 0926)  HIV: Non Reactive (08/27 0926)  GC/Chlamydia: negative GBS: Negative (11/25 0000)  2-hr GTT: positive Genetic screening:  NIPS low risk Anatomy US: normal, mild polyhydramnios  Prenatal Transfer Tool  Maternal Diabetes: Yes:  Diabetes Type:  Insulin/Medication controlled Genetic Screening: Normal Maternal Ultrasounds/Referrals: Abnormal:  Findings:   Other: mild polyhradmnios Fetal Ultrasounds or other Referrals:  Referred to Materal Fetal Medicine  Maternal Substance Abuse:  No Significant Maternal Medications:  Meds include: Other: aspirin, metformin Significant Maternal Lab Results: None  Results for orders placed or performed during the hospital encounter of  04/17/18 (from the past 24 hour(s))  CBC   Collection Time: 04/17/18  8:36 AM  Result Value Ref Range   WBC 8.1 4.0 - 10.5 K/uL   RBC 4.04 3.87 - 5.11 MIL/uL   Hemoglobin 13.0 12.0 - 15.0 g/dL   HCT 38.4 36.0 - 46.0 %   MCV 95.0 80.0 - 100.0 fL   MCH 32.2 26.0 - 34.0 pg   MCHC 33.9 30.0 - 36.0 g/dL   RDW 13.7 11.5 - 15.5 %   Platelets 154 150 - 400 K/uL   nRBC 0.0 0.0 - 0.2 %    Patient Active Problem List   Diagnosis Date Noted  . Gestational diabetes mellitus (GDM) in third trimester 04/17/2018  . Anemia, antepartum 02/10/2018  .  Diabetes mellitus complicating pregnancy, antepartum 01/27/2018  . Polyhydramnios affecting pregnancy in third trimester 01/04/2018  . History of gestational diabetes 12/27/2017  . Supervision of high-risk pregnancy 12/27/2017  . AMA (advanced maternal age) multigravida 41+, second trimester 12/27/2017  . Gestational diabetes 07/10/2016    Assessment: Jill Ray is a 37 y.o. [phone] at 50w2dhere for IOL 2/2 GDM, mild polyhydramnios, AMA, anemia  #Labor: active phase #Pain: Fentanyl #FWB: Category I #ID:  GBS negative #MOF: both #MOC:nexplanon #Circ:  yes  CDoristine MangoDO 04/17/2018, 10:04 AM   Midwife attestation: I have seen and examined this patient; I agree with above documentation in the resident's note.   PE: Gen: calm comfortable, NAD Resp: normal effort and rate Abd: gravid  ROS, labs, PMH reviewed  Assessment/Plan: Jill Buchananis a 37y.o. [phone] for IOL but in active labor  Admit to LD Labor: active FWB: Cat I ID: GBS neg GBG q4 hr Expectant mngt, anticipate SVD  MJulianne Handler CNM  04/17/2018, 11:03 AM

## 2018-04-17 NOTE — Progress Notes (Addendum)
Labor Progress Note Jill Ray is a 37 y.o. [phone] at [redacted]w[redacted]d presented for IOL, now with spontaneous labor  S:  Feeling ctx, uncomfortable, requests pain meds  O:  BP 127/86 (BP Location: Right Arm)   Pulse (!) 106   Temp 98.4 F (36.9 C) (Oral)   Resp 20   Ht 5\' 4"  (1.626 m)   Wt 86.8 kg   LMP 07/16/2017   BMI 32.85 kg/m  EFM: baseline 140 bpm/ mod variability/ + accels/ variable and early decels  Toco: 3-5 SVE: Dilation: 7 Effacement (%): 90 Cervical Position: Anterior Presentation: Vertex Exam by:: Julianne Handler, CNM   A/P: 37 y.o. G4P3003 [redacted]w[redacted]d  1. Labor: active 2. FWB: Cat II 3. Pain: analgesia prn  Suspected LGA, prepare for delivery complications. Anticipate labor progression and SVD.  Julianne Handler, CNM 10:58 AM

## 2018-04-18 LAB — CBC
HCT: 34.9 % — ABNORMAL LOW (ref 36.0–46.0)
Hemoglobin: 11.7 g/dL — ABNORMAL LOW (ref 12.0–15.0)
MCH: 32 pg (ref 26.0–34.0)
MCHC: 33.5 g/dL (ref 30.0–36.0)
MCV: 95.4 fL (ref 80.0–100.0)
NRBC: 0 % (ref 0.0–0.2)
Platelets: 134 10*3/uL — ABNORMAL LOW (ref 150–400)
RBC: 3.66 MIL/uL — ABNORMAL LOW (ref 3.87–5.11)
RDW: 13.8 % (ref 11.5–15.5)
WBC: 8.9 10*3/uL (ref 4.0–10.5)

## 2018-04-18 LAB — GLUCOSE, CAPILLARY: GLUCOSE-CAPILLARY: 106 mg/dL — AB (ref 70–99)

## 2018-04-18 NOTE — Lactation Note (Signed)
This note was copied from a baby's chart. Lactation Consultation Note  Patient Name: Jill Ray [id number] Date: 04/18/2018  Infant sleeping in crib on arrival.  Mom reports he last ate around 230 pm. Urged mom to feed him.  Mom reports she has no milk.  Able to hand express colostrum easily out of the right breast.  Put drops in a spoon and used it to rouse infant to feed.  Un wrapped him, took his outfit off and he had a large wet diaper.Changed diaper and gave him to mom.   After a few attempts and continued stimulation  He latched and breastfed well. Also responded well to breast compression to help keep him going.  Left mom and baby breastfeeding.  Urged mom to hand express past each breastfeed and feed back all EBM to him. Demo how to use manual pump and encouraged mom to use manaul pump and try if she is unable to get any with hand expression. Gave mom 27 mm for manual breastpump. Urged mom to hand express and pre pump prior to latching also. Infant with no stool at 26 hours old/however meconium stained.  Discussed with mom possibility of initiating pumping with DEBP if he doesn't have a stool soon.  Urged mom to call lactation as needed.  Maternal Data    Feeding Feeding Type: Breast Fed  LATCH Score                   Interventions    Lactation Tools Discussed/Used     Consult Status      Cordera Stineman Thompson Caul 04/18/2018, 6:00 PM

## 2018-04-18 NOTE — Progress Notes (Signed)
POSTPARTUM PROGRESS NOTE  Post Partum Day 1  Subjective:  Jill Ray is a 37 y.o. [id number] s/p SVD at [redacted]w[redacted]d.  She reports she is doing well. No acute events overnight. She denies any problems with ambulating, voiding or po intake. Denies nausea or vomiting.  Pain is well controlled.  Lochia is appropriate.  Objective: Blood pressure 114/77, pulse 95, temperature 98.3 F (36.8 C), temperature source Oral, resp. rate 18, height 5\' 4"  (1.626 m), weight 86.8 kg, last menstrual period 07/16/2017, SpO2 96 %, unknown if currently breastfeeding.  Physical Exam:  General: alert, cooperative and no distress Chest: no respiratory distress Heart:regular rate, distal pulses intact Abdomen: soft, nontender,  Uterine Fundus: firm, appropriately tender DVT Evaluation: No calf swelling or tenderness Extremities: No LE edema Skin: warm, dry  Recent Labs    04/17/18 0836 04/18/18 0547  HGB 13.0 11.7*  HCT 38.4 34.9*    Assessment/Plan: Jill Ray is a 37 y.o. [id number] s/p SVD at [redacted]w[redacted]d   PPD#1 - Doing well  Routine postpartum care GDM: Ordered fasting CBG for this AM, pending.  Contraception: Nexplanon   Feeding: breast and bottle  Dispo: Plan for discharge PPD#2. Plan for circ outpatient.    LOS: 1 day   Phill Myron, D.O. OB Fellow  04/18/2018, 8:42 AM

## 2018-04-18 NOTE — Anesthesia Postprocedure Evaluation (Signed)
Anesthesia Post Note  Patient: Jill Ray  Procedure(s) Performed: AN AD [place]     Patient location during evaluation: Mother Baby Anesthesia Type: Epidural Level of consciousness: awake Pain management: pain level controlled Vital Signs Assessment: post-procedure vital signs reviewed and stable Respiratory status: spontaneous breathing Cardiovascular status: stable Postop Assessment: patient able to bend at knees and epidural receding Anesthetic complications: no    Last Vitals:  Vitals:   04/18/18 0200 04/18/18 0556  BP: 111/71 114/77  Pulse: 93 95  Resp: 14 18  Temp: 37 C 36.8 C  SpO2: 95% 96%    Last Pain:  Vitals:   04/18/18 0557  TempSrc:   PainSc: 0-No pain   Pain Goal: Patients Stated Pain Goal: 8 (04/17/18 0813)               Everette Rank

## 2018-04-19 MED ORDER — IBUPROFEN 600 MG PO TABS: 600.0000 mg | ORAL_TABLET | Freq: Four times a day (QID) | ORAL | 0 refills | Status: DC

## 2018-04-19 NOTE — Discharge Instructions (Signed)

## 2018-04-19 NOTE — Lactation Note (Signed)
This note was copied from a baby's chart. Lactation Consultation Note  Patient Name: Boy Cecila Satcher JIZXY'O Date: 04/19/2018 Reason for consult: Follow-up assessment Mom reports infant has still not stooled.  Mom using manual pump on arrival and infant in crib cuing.  Assisted mom with feeding infant at the breast.  Infant latched and breastfed well.  Set up DEBP for mom while infant feeding at the breast.  Urged mom to offer the breast every 2 hours and on cue.  Urged mom to unswaddle him, and take his clothes off to try and get him awake to feed.  Urged mom to pump past breastfeeds with DEBP and feed back to him whatever she gets.  Mom voiced understanding.  Urged her to call lactation as needed.  Left room and infant still breastfeeding.  He needs stimulation or he tends to fall asleep.  Maternal Data Has patient been taught Hand Expression?: Yes  Feeding Feeding Type: Breast Fed Nipple Type: Slow - flow  LATCH Score Latch: Grasps breast easily, tongue down, lips flanged, rhythmical sucking.  Audible Swallowing: A few with stimulation  Type of Nipple: Everted at rest and after stimulation  Comfort (Breast/Nipple): Soft / non-tender  Hold (Positioning): Assistance needed to correctly position infant at breast and maintain latch.  LATCH Score: 8  Interventions Interventions: Assisted with latch;Hand express;DEBP  Lactation Tools Discussed/Used Pump Review: Setup, frequency, and cleaning;Milk Storage Initiated by:: Machi Whittaker Date initiated:: 04/19/18   Consult Status Consult Status: Follow-up Date: 04/19/18 Follow-up type: In-patient    Asucena Galer Thompson Caul 04/19/2018, 1:54 PM

## 2018-04-19 NOTE — Discharge Summary (Addendum)
OB Discharge Summary     Patient Name: Jill Ray DOB: 11-Feb-1981 MRN: [id number]  Date of admission: 04/17/2018 Delivering MD: Richarda Osmond   Date of discharge: 04/19/2018  Admitting diagnosis: 39wks labor Intrauterine pregnancy: [redacted]w[redacted]d    Secondary diagnosis:  Active Problems:   Gestational diabetes mellitus (GDM) in third trimester  Additional problems: A2GDM Mild polyhydramnios AMA Anemia Intrapartum HTN     Discharge diagnosis: Term Pregnancy Delivered                                                                                                Post partum procedures:none  Augmentation: none  Complications: None  Hospital course:  Onset of Labor With Vaginal Delivery     37y.o. yo [id number] 348w2das admitted in Latent Labor on 04/17/2018. Patient had an uncomplicated labor course as follows:  Membrane Rupture Time/Date: 2:01 PM ,04/17/2018   Intrapartum Procedures: Episiotomy: None [1]                                         Lacerations:  2nd degree [3]  Patient had a delivery of a Viable infant. 04/17/2018  Information for the patient's newborn:  SeZoe, Goonan0[[id number]]Delivery Method: Vaginal, Spontaneous(Filed from Delivery Summary)    Pateint had an uncomplicated postpartum course.  She is ambulating, tolerating a regular diet, passing flatus, and urinating well. Patient is discharged home in stable condition on 04/19/18.   Physical exam  Vitals:   04/18/18 0556 04/18/18 1414 04/18/18 2340 04/19/18 0621  BP: 114/77 126/73 122/88 95/65  Pulse: 95 (!) 103 96 98  Resp: '18 17 18 16  ' Temp: 98.3 F (36.8 C) 98.6 F (37 C) 97.8 F (36.6 C) 98.2 F (36.8 C)  TempSrc: Oral  Oral Oral  SpO2: 96% 99%  99%  Weight:      Height:       General: alert, cooperative and no distress Lochia: appropriate Uterine Fundus: firm Incision: Healing well with no significant drainage, N/A DVT Evaluation: No evidence of DVT seen on physical  exam. Bilateral lower extremity trace edema present- symmetrical.  Labs: Lab Results  Component Value Date   WBC 8.9 04/18/2018   HGB 11.7 (L) 04/18/2018   HCT 34.9 (L) 04/18/2018   MCV 95.4 04/18/2018   PLT 134 (L) 04/18/2018   CMP Latest Ref Rng & Units 04/17/2018  Glucose 70 - 99 mg/dL 112(H)  BUN 6 - 23 mg/dL -  Creatinine 0.50 - 1.10 mg/dL -  Sodium 135 - 145 mEq/L -  Potassium 3.5 - 5.1 mEq/L -  Chloride 96 - 112 mEq/L -  CO2 19 - 32 mEq/L -  Calcium 8.4 - 10.5 mg/dL -  Total Protein 6.0 - 8.3 g/dL -  Total Bilirubin 0.3 - 1.2 mg/dL -  Alkaline Phos 39 - 117 U/L -  AST 0 - 37 U/L -  ALT 0 - 35 U/L -    Discharge instruction: per After  Visit Summary and "Baby and Me Booklet".  After visit meds:  Allergies as of 04/19/2018      Reactions   Levofloxacin Itching      Medication List    STOP taking these medications   ACCU-CHEK FASTCLIX LANCETS Misc   ACCU-CHEK GUIDE w/Device Kit   aspirin EC 81 MG tablet   ferrous sulfate 325 (65 FE) MG tablet   glucose blood test strip   metFORMIN 500 MG tablet Commonly known as:  GLUCOPHAGE   ranitidine 150 MG tablet Commonly known as:  ZANTAC     TAKE these medications   ibuprofen 600 MG tablet Commonly known as:  ADVIL,MOTRIN Take 1 tablet (600 mg total) by mouth every 6 (six) hours.   Prenatal Vitamins 0.8 MG tablet Take 1 tablet by mouth daily.       Diet: routine diet  Activity: Advance as tolerated. Pelvic rest for 6 weeks.   Outpatient follow up:4 weeks Follow up Appt: Future Appointments  Date Time Provider [place]  05/16/2018  1:15 PM Nicolette Bang, DO [facility]   Follow up Visit:No follow-ups on file.  Postpartum contraception: Nexplanon  Newborn Data: Live born female  Birth Weight: 8 lb 3.7 oz (3734 g) APGAR: 7, 9  Newborn Delivery   Birth date/time:  04/17/2018 14:22:00 Delivery type:  Vaginal, Spontaneous     Baby Feeding: Bottle and  Breast Disposition:home with mother   04/19/2018 Richarda Osmond, DO   I spoke with and examined patient and agree with resident/PA-S/MS/SNM's note and plan of care.  Abstinence until [place], CNM, [facility] 04/19/2018 9:32 AM

## 2018-05-09 ENCOUNTER — Encounter: Payer: Self-pay | Admitting: *Deleted

## 2018-05-15 ENCOUNTER — Ambulatory Visit: Payer: Self-pay | Admitting: Advanced Practice Midwife

## 2018-05-16 ENCOUNTER — Encounter: Payer: Self-pay | Admitting: Internal Medicine

## 2018-05-16 ENCOUNTER — Ambulatory Visit (INDEPENDENT_AMBULATORY_CARE_PROVIDER_SITE_OTHER): Payer: Medicaid Other | Admitting: Internal Medicine

## 2018-05-16 DIAGNOSIS — Z30017 Encounter for initial prescription of implantable subdermal contraceptive: Secondary | ICD-10-CM | POA: Diagnosis not present

## 2018-05-16 DIAGNOSIS — Z1389 Encounter for screening for other disorder: Secondary | ICD-10-CM | POA: Diagnosis not present

## 2018-05-16 DIAGNOSIS — O24410 Gestational diabetes mellitus in pregnancy, diet controlled: Secondary | ICD-10-CM

## 2018-05-16 LAB — POCT PREGNANCY, URINE: Preg Test, Ur: NEGATIVE

## 2018-05-16 MED ORDER — ETONOGESTREL 68 MG ~~LOC~~ IMPL
68.0000 mg | DRUG_IMPLANT | Freq: Once | SUBCUTANEOUS | Status: AC
  Administered 2018-05-16: 68 mg via SUBCUTANEOUS

## 2018-05-16 NOTE — Progress Notes (Signed)
Subjective:     Jill Ray is a 38 y.o. female who presents for a postpartum visit. She is 4 weeks postpartum following a spontaneous vaginal delivery. I have fully reviewed the prenatal and intrapartum course. The delivery was at [redacted]w[redacted]d gestational weeks. Outcome: spontaneous vaginal delivery. Anesthesia: epidural. Postpartum course has been unremarkable. Baby's course has been uncomplicated. Baby is feeding by both breast and bottle - Gerber. Bleeding staining only. Bowel function is normal. Bladder function is normal. Patient is not sexually active. Contraception method is Nexplanon. Postpartum depression screening: negative.  The following portions of the patient's history were reviewed and updated as appropriate: allergies, current medications, past family history, past medical history, past social history, past surgical history and problem list.  Review of Systems Pertinent items are noted in HPI.   Objective:    BP 111/77   Pulse 98   Ht 5\' 4"  (1.626 m)   Wt 171 lb 6.4 oz (77.7 kg)   LMP 07/16/2017   Breastfeeding Yes   BMI 29.42 kg/m   General:  alert and cooperative  Lungs: clear to auscultation bilaterally  Heart:  regular rate and rhythm, S1, S2 normal, no murmur, click, rub or gallop  Abdomen: soft, non-tender; bowel sounds normal; no masses,  no organomegaly   Vulva:  normal  Vagina: normal vagina, perineum is intact   Rectal Exam: Normal rectovaginal exam         Procedure Note: Nexplanon insertion She is right-handed, so her left arm, approximately 10 cm proximal from the elbow, was cleansed with alcohol and anesthetized with 2 mL of 1% Lidocaine with Epi.  The area was cleansed again with betadine and the Nexplanon was inserted per manufacturer's recommendations without difficulty.  A pressure bandage were applied.  Pt was instructed to keep the area clean and dry, remove pressure bandage in 24 hours.  She was given a card indicating date Nexplanon was inserted  and date it needs to be removed. Follow-up PRN problems.  Assessment/Plan:   1. Postpartum care and examination Benign postpartum exam.  Pap smear not done at today's visit. Last PAP Oct 2017 was negative for intraepithelial lesions or malignancy. HPV co-testing not available for review. Instructed to return Oct 2020 for repeat PAP with co-testing.   2. Insertion of Nexplanon Patient tolerated procedure well. After care instructions and return precautions reviewed.  - etonogestrel (NEXPLANON) implant 68 mg  3. Diet controlled gestational diabetes mellitus (GDM), antepartum Return in next 2 weeks for PP 2hr GTT.   Phill Myron, D.O. [facility] Family Medicine Fellow, [facility] for [facility] 05/16/2018, 1:55 PM

## 2018-05-16 NOTE — Patient Instructions (Signed)
Please make a lab visit in the next two weeks to have a glucose tolerance test done. You should be fasting with nothing to eat or drink except for water after midnight.   Congratulations on getting your Nexplanon placement!  Below is some important information about Nexplanon.  First remember that Nexplanon does not prevent sexually transmitted infections.  Condoms will help prevent sexually transmitted infections. The Nexplanon starts working 7 days after it was inserted.  There is a risk of getting pregnant if you have unprotected sex in those first 7 days after placement of the Nexplanon.  The Nexplanon lasts for 3 years but can be removed at any time.  You can become pregnant as early as 1 week after removal.  You can have a new Nexplanon put in after the old one is removed if you like.  Always tell other healthcare providers that you have a Nexplanon in your arm.  The Nexplanon was placed just under the skin.  Leave the outside bandage on for 24 hours.  Leave the smaller bandage on for 3-5 days or until it falls off on its own.  Keep the area clean and dry for 3-5 days. There is usually bruising or swelling at the insertion site for a few days to a week after placement.  If you see redness or pus draining from the insertion site, call us immediately.  Keep your user card with the date the implant was placed and the date the implant is to be removed.  The most common side effect is a change in your menstrual bleeding pattern.   This bleeding is generally not harmful to you but can be annoying.  Call or come in to see [place] if you have any concerns about the bleeding or if you have any side effects or questions.

## 2018-05-29 ENCOUNTER — Other Ambulatory Visit: Payer: Self-pay | Admitting: *Deleted

## 2018-05-29 DIAGNOSIS — Z8632 Personal history of gestational diabetes: Secondary | ICD-10-CM

## 2018-05-30 ENCOUNTER — Other Ambulatory Visit: Payer: Medicaid Other

## 2018-05-30 DIAGNOSIS — Z8632 Personal history of gestational diabetes: Secondary | ICD-10-CM

## 2018-05-31 LAB — GLUCOSE TOLERANCE, 2 HOURS
GLUCOSE, 2 HOUR: 101 mg/dL (ref 65–139)
Glucose, GTT - Fasting: 82 mg/dL (ref 65–99)

## 2019-10-04 ENCOUNTER — Other Ambulatory Visit: Payer: Self-pay | Admitting: Pediatrics

## 2019-10-04 MED ORDER — MEFLOQUINE HCL 250 MG PO TABS: 250.0000 mg | ORAL_TABLET | ORAL | 3 refills | Status: AC

## 2019-10-10 ENCOUNTER — Ambulatory Visit: Payer: Medicaid Other | Attending: Internal Medicine

## 2019-10-10 DIAGNOSIS — Z20822 Contact with and (suspected) exposure to covid-19: Secondary | ICD-10-CM | POA: Diagnosis not present

## 2019-10-11 LAB — NOVEL CORONAVIRUS, NAA

## 2019-10-15 ENCOUNTER — Ambulatory Visit: Payer: Medicaid Other | Attending: Internal Medicine

## 2019-10-15 ENCOUNTER — Other Ambulatory Visit: Payer: Self-pay

## 2019-10-15 DIAGNOSIS — Z20822 Contact with and (suspected) exposure to covid-19: Secondary | ICD-10-CM | POA: Diagnosis not present

## 2019-10-15 DIAGNOSIS — Z1159 Encounter for screening for other viral diseases: Secondary | ICD-10-CM | POA: Diagnosis not present

## 2019-10-16 LAB — NOVEL CORONAVIRUS, NAA: SARS-CoV-2, NAA: NOT DETECTED

## 2019-10-16 LAB — SARS-COV-2, NAA 2 DAY TAT

## 2021-03-18 ENCOUNTER — Emergency Department (HOSPITAL_COMMUNITY): Payer: Medicaid Other

## 2021-03-18 ENCOUNTER — Encounter (HOSPITAL_COMMUNITY): Payer: Self-pay

## 2021-03-18 ENCOUNTER — Other Ambulatory Visit: Payer: Self-pay

## 2021-03-18 ENCOUNTER — Emergency Department (HOSPITAL_COMMUNITY)
Admission: EM | Admit: 2021-03-18 | Discharge: 2021-03-18 | Disposition: A | Discharge status: Home / Self Care | Payer: Medicaid Other | Attending: Emergency Medicine | Admitting: Emergency Medicine

## 2021-03-18 DIAGNOSIS — E119 Type 2 diabetes mellitus without complications: Secondary | ICD-10-CM | POA: Insufficient documentation

## 2021-03-18 DIAGNOSIS — T1490XA Injury, unspecified, initial encounter: Secondary | ICD-10-CM

## 2021-03-18 DIAGNOSIS — M5416 Radiculopathy, lumbar region: Secondary | ICD-10-CM | POA: Insufficient documentation

## 2021-03-18 DIAGNOSIS — S3992XA Unspecified injury of lower back, initial encounter: Secondary | ICD-10-CM | POA: Insufficient documentation

## 2021-03-18 DIAGNOSIS — S8991XA Unspecified injury of right lower leg, initial encounter: Secondary | ICD-10-CM | POA: Diagnosis present

## 2021-03-18 DIAGNOSIS — M79604 Pain in right leg: Secondary | ICD-10-CM | POA: Diagnosis not present

## 2021-03-18 DIAGNOSIS — I10 Essential (primary) hypertension: Secondary | ICD-10-CM | POA: Insufficient documentation

## 2021-03-18 DIAGNOSIS — Y92410 Unspecified street and highway as the place of occurrence of the external cause: Secondary | ICD-10-CM | POA: Diagnosis not present

## 2021-03-18 DIAGNOSIS — M549 Dorsalgia, unspecified: Secondary | ICD-10-CM | POA: Diagnosis not present

## 2021-03-18 DIAGNOSIS — M79651 Pain in right thigh: Secondary | ICD-10-CM | POA: Diagnosis not present

## 2021-03-18 DIAGNOSIS — S8011XA Contusion of right lower leg, initial encounter: Secondary | ICD-10-CM | POA: Diagnosis not present

## 2021-03-18 DIAGNOSIS — S299XXA Unspecified injury of thorax, initial encounter: Secondary | ICD-10-CM | POA: Diagnosis not present

## 2021-03-18 DIAGNOSIS — R911 Solitary pulmonary nodule: Secondary | ICD-10-CM | POA: Diagnosis not present

## 2021-03-18 DIAGNOSIS — R200 Anesthesia of skin: Secondary | ICD-10-CM | POA: Diagnosis not present

## 2021-03-18 DIAGNOSIS — M5489 Other dorsalgia: Secondary | ICD-10-CM | POA: Diagnosis not present

## 2021-03-18 DIAGNOSIS — R079 Chest pain, unspecified: Secondary | ICD-10-CM | POA: Diagnosis not present

## 2021-03-18 LAB — CBC WITH DIFFERENTIAL/PLATELET
Abs Immature Granulocytes: 0.02 10*3/uL (ref 0.00–0.07)
Basophils Absolute: 0 10*3/uL (ref 0.0–0.1)
Basophils Relative: 1 %
Eosinophils Absolute: 0.1 10*3/uL (ref 0.0–0.5)
Eosinophils Relative: 2 %
HCT: 39.9 % (ref 36.0–46.0)
Hemoglobin: 13.5 g/dL (ref 12.0–15.0)
Immature Granulocytes: 0 %
Lymphocytes Relative: 19 %
Lymphs Abs: 1.4 10*3/uL (ref 0.7–4.0)
MCH: 31.6 pg (ref 26.0–34.0)
MCHC: 33.8 g/dL (ref 30.0–36.0)
MCV: 93.4 fL (ref 80.0–100.0)
Monocytes Absolute: 0.5 10*3/uL (ref 0.1–1.0)
Monocytes Relative: 7 %
Neutro Abs: 5.3 10*3/uL (ref 1.7–7.7)
Neutrophils Relative %: 71 %
Platelets: 233 10*3/uL (ref 150–400)
RBC: 4.27 MIL/uL (ref 3.87–5.11)
RDW: 11.8 % (ref 11.5–15.5)
WBC: 7.4 10*3/uL (ref 4.0–10.5)
nRBC: 0 % (ref 0.0–0.2)

## 2021-03-18 LAB — BASIC METABOLIC PANEL
Anion gap: 8 (ref 5–15)
BUN: 13 mg/dL (ref 6–20)
CO2: 22 mmol/L (ref 22–32)
Calcium: 9.3 mg/dL (ref 8.9–10.3)
Chloride: 107 mmol/L (ref 98–111)
Creatinine, Ser: 0.78 mg/dL (ref 0.44–1.00)
GFR, Estimated: >60 mL/min (ref >60)
Glucose, Bld: 121 mg/dL — ABNORMAL HIGH (ref 70–99)
Potassium: 3.5 mmol/L (ref 3.5–5.1)
Sodium: 137 mmol/L (ref 135–145)

## 2021-03-18 MED ORDER — IOHEXOL 300 MG/ML  SOLN
75.0000 mL | Freq: Once | INTRAMUSCULAR | Status: AC | PRN
  Administered 2021-03-18: 75 mL via INTRAVENOUS

## 2021-03-18 MED ORDER — ALBUTEROL SULFATE HFA 108 (90 BASE) MCG/ACT IN AERS
2.0000 | INHALATION_SPRAY | Freq: Once | RESPIRATORY_TRACT | Status: AC
  Administered 2021-03-18: 2 via RESPIRATORY_TRACT
  Filled 2021-03-18: qty 6.7

## 2021-03-18 MED ORDER — IBUPROFEN 600 MG PO TABS: 600.0000 mg | ORAL_TABLET | Freq: Three times a day (TID) | ORAL | 0 refills | Status: AC
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
## 2021-03-18 NOTE — ED Notes (Signed)
Pt transported to CT ?

## 2021-03-19 ENCOUNTER — Telehealth: Payer: Self-pay

## 2021-03-19 NOTE — Telephone Encounter (Signed)
Transition Care Management Follow-up Telephone Call Date of discharge and from where: 03/18/2021-  How have you been since you were released from the hospital? Patient is ok but still has back pain. Patient will call Neurosurgeon.  Any questions or concerns? No  Items Reviewed: Did the pt receive and understand the discharge instructions provided? Yes  Medications obtained and verified?  No medications sent to pharmacy. Patient is taking OTC meds.  Other? No  Any new allergies since your discharge? No  Dietary orders reviewed? No Do you have support at home? Yes   Home Care and Equipment/Supplies: Were home health services ordered? not applicable If so, what is the name of the agency? N/A  Has the agency set up a time to come to the patient's home? not applicable Were any new equipment or medical supplies ordered?  No What is the name of the medical supply agency? N/A Were you able to get the supplies/equipment? not applicable Do you have any questions related to the use of the equipment or supplies? No  Functional Questionnaire: (I = Independent and D = Dependent) ADLs: I  Bathing/Dressing- I  Meal Prep- I  Eating- I  Maintaining continence- I  Transferring/Ambulation- I  Managing Meds- I  Follow up appointments reviewed:  PCP Hospital f/u appt confirmed? No   Specialist Hospital f/u appt confirmed? No   Are transportation arrangements needed? No  If their condition worsens, is the pt aware to call PCP or go to the Emergency Dept.? Yes Was the patient provided with contact information for the PCP's office or ED? Yes Was to pt encouraged to call back with questions or concerns? Yes
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
## 2021-04-07 DIAGNOSIS — M545 Low back pain, unspecified: Secondary | ICD-10-CM | POA: Diagnosis not present

## 2021-04-07 DIAGNOSIS — M542 Cervicalgia: Secondary | ICD-10-CM | POA: Diagnosis not present

## 2021-04-14 ENCOUNTER — Encounter: Payer: Self-pay | Admitting: Physical Therapy

## 2021-04-14 ENCOUNTER — Ambulatory Visit: Payer: Medicaid Other | Attending: Neurological Surgery | Admitting: Physical Therapy

## 2021-04-14 ENCOUNTER — Other Ambulatory Visit: Payer: Self-pay

## 2021-04-14 DIAGNOSIS — M545 Low back pain, unspecified: Secondary | ICD-10-CM | POA: Insufficient documentation

## 2021-04-14 DIAGNOSIS — M6281 Muscle weakness (generalized): Secondary | ICD-10-CM | POA: Insufficient documentation

## 2021-04-14 NOTE — Patient Instructions (Signed)
Access Code: [email or web address] URL: [email or web address] Date: 04/14/2021 Prepared by: Hilda Blades  Exercises Supine Lower Trunk Rotation - 1-2 x daily - 7 x weekly - 5 reps - 10 seconds hold Supine Piriformis Stretch with Foot on Ground - 1-2 x daily - 7 x weekly - 3 reps - 30 seconds hold Bridge - 1 x daily - 7 x weekly - 2 sets - 10 reps Straight Leg Raise - 1 x daily - 7 x weekly - 2 sets - 10 reps Sidelying Hip Abduction - 1 x daily - 7 x weekly - 2 sets - 10 reps Seated Hamstring Stretch - 1 x daily - 7 x weekly - 3 sets - 3 reps - 30 seconds hold Seated Scapular Retraction - 1 x daily - 7 x weekly - 2 sets - 5 reps - 3 seconds hold
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
## 2021-04-21 ENCOUNTER — Ambulatory Visit: Payer: Medicaid Other | Admitting: Physical Therapy

## 2021-04-22 ENCOUNTER — Telehealth: Payer: Self-pay | Admitting: Physical Therapy

## 2021-04-22 NOTE — Telephone Encounter (Signed)
Attempted to contact patient due to missed PT appointment. Left VM informing patient of missed appointment and reminded of next scheduled appointment. Patient advised of attendance policy.  Hilda Blades, PT, DPT, LAT, ATC 04/22/21  8:16 AM Phone: [phone] Fax: [phone]

## 2021-04-30 ENCOUNTER — Encounter: Payer: Self-pay | Admitting: Physical Therapy

## 2021-04-30 ENCOUNTER — Other Ambulatory Visit: Payer: Self-pay

## 2021-04-30 ENCOUNTER — Ambulatory Visit: Payer: Medicaid Other | Admitting: Physical Therapy

## 2021-04-30 DIAGNOSIS — M6281 Muscle weakness (generalized): Secondary | ICD-10-CM

## 2021-04-30 DIAGNOSIS — M545 Low back pain, unspecified: Secondary | ICD-10-CM

## 2021-04-30 NOTE — Therapy (Signed)
OUTPATIENT PHYSICAL THERAPY TREATMENT NOTE   Patient Name: Jill Ray MRN: [id number] DOB:1980-09-11, 40 y.o., female Today's Date: 04/30/2021  PCP: Patient, No Pcp Per (Inactive) REFERRING PROVIDER: Eustace Moore, MD   PT End of Session - 04/30/21 1053     Visit Number 2    Number of Visits 8    Date for PT Re-Evaluation 06/09/21    Authorization Type Med Pay / [employer]    Authorization Time Period pending auth    PT Start Time 1829    PT Stop Time 1129    PT Time Calculation (min) 38 min    Activity Tolerance Patient tolerated treatment well    Behavior During Therapy [facility] for tasks assessed/performed             Past Medical History:  Diagnosis Date   Anemia    takes Iron supplements   Asthma    dust, exhaustion, exercise trigger attacks; pt cannot remember when she last used inhaler   Diabetes mellitus without complication ([place])    Gallstone pancreatitis 08/08/2012   Archie Endo 08/08/2012   Gallstone pancreatitis 08/14/2012   Gestational diabetes    Metformin   Umbilical hernia    hernia (reducible umbilical hernia) is present. Archie Endo 08/08/2012   Urinary tract infection    Past Surgical History:  Procedure Laterality Date   CHOLECYSTECTOMY N/A 08/11/2012   Procedure: LAPAROSCOPIC CHOLECYSTECTOMY;  Surgeon: Rolm Bookbinder, MD;  Location: [place];  Service: General;  Laterality: N/A;   ERCP N/A 08/13/2012   Procedure: ENDOSCOPIC RETROGRADE CHOLANGIOPANCREATOGRAPHY (ERCP);  Surgeon: Jeryl Columbia, MD;  Location: [place];  Service: Endoscopy;  Laterality: N/A;   NO PAST SURGERIES     SPHINCTEROTOMY  08/13/2012   Procedure: SPHINCTEROTOMY;  Surgeon: Jeryl Columbia, MD;  Location: [facility] OR;  Service: Endoscopy;;   Patient Active Problem List   Diagnosis Date Noted   Insertion of Nexplanon 05/16/2018   History of gestational diabetes 12/27/2017   Gestational diabetes 07/10/2016    REFERRING PROVIDER: Eustace Moore, MD   REFERRING DIAG: Low back pain,  unspecified  ONSET DATE: 03/18/2021  THERAPY DIAG:  Acute bilateral low back pain, unspecified whether sciatica present  Muscle weakness (generalized)  PERTINENT HISTORY: N/A  PRECAUTIONS: None WEIGHT BEARING RESTRICTIONS: No   SUBJECTIVE: Patient reports she is doing better, she still has trouble standing for long periods.   PAIN:  Are you having pain? Yes VAS scale: 5/10 Pain location: Lower back Pain orientation: Bilateral and Lower  PAIN TYPE: Chronic, intermittent Pain description:  tightness, aching   Aggravating factors: standing extended periods Relieving factors: Stretches, rest   OBJECTIVE:  PATIENT SURVEYS:  Modified Oswestry 10/50 (last assessed 04/14/2021)   LUMBAR AROM   AROM  04/14/2021  04/30/2021  Flexion WFL WFL  Extension WFL   Right lateral flexion WFL   Left lateral flexion WFL   Right rotation [facility] WFL  Left rotation [facility]   Patient reported increased pulling of lower back with lumbar flexion and rotation   LE MMT:   MMT Right 04/14/2021 Left 04/14/2021  Hip flexion 4 4-  Hip extension 4- 4-  Hip abduction 4- 4-    TODAY'S TREATMENT  04/30/21: NuSTep L5 x 5 min with UE/LE while taking subejctive  LTR x 10 Piriformis stretch 2 x 30 sec each Sidelying thoracic rotation x 10 each Sidelying hip abduction 2 x 10 each Bridge 2 x 10 with 3 sec hold Sit to stand holding 10#  at chest 2 x 10 Row with black 2 x 10 Deadlift from 6" with 15# 2 x 10 - cues require for technique to maintain lumbopelvic control    Eval: LTR 3 x 10 sec Piriformis stretch x 30 sec each Bridge x 5 SLR x 5 each Sidelying hip abduction x 5 each Seated hamstring stretch x 30 sec each Seated shoulder blade squeezes 5 x 5 sec hold   PATIENT EDUCATION:  Education details: HEP Person educated: Patient Education method: Consulting civil engineer, Media planner, Verbal cues Education comprehension: verbalized understanding, returned demonstration, verbal cues required,  and needs further education   HOME EXERCISE PROGRAM: Access Code: [email or web address]    ASSESSMENT: CLINICAL IMPRESSION: Patient tolerated therapy well with no adverse effects. Therapy focused primarily on progressing core and hip strength to improve standing tolerance. Patient able to progress with weighted exercises and initiate lifting. Patient does require frequent cueing for technique and control with lifting and does fatigue quickly. Patient would benefit from continued skilled PT to progress mobility and strength in order to reduce pain and maximize functional ability.   GOALS: Goals reviewed with patient? Yes   SHORT TERM GOALS:   STG Name Target Date Goal status  1 Patient will be I with initial HEP in order to progress with therapy. Baseline: HEP provided at eval 05/12/2020 INITIAL  2 Patient will report pain level </= 3/10 to reduce functional limitations with household tasks Baseline: Patient reports current pain 5/10, can get up to 6-7/10 05/12/2021 INITIAL  3 Patient will demonstrate appropriate lifting mechanics to reduce low back pain with work Baseline: Patient exhibits excessive lumbar flexion with lifting 05/12/2021 INITIAL    LONG TERM GOALS:    LTG Name Target Date Goal status  1 Patient will be I with final HEP to maintain progress from PT. Baseline: HEP provided at eval 06/09/2021 INITIAL  2 Patient will report </= 2/50 on Modified ODI in order to indicate improved functional ability Baseline:10/50 06/09/2021 INITIAL  3 Patient will demonstrate hip strength >/= 4/5 MMT in order to improve standing tolerance Baseline: grossly 4-/5 MMT 06/09/2021 INITIAL  4 Patient will report no increase in pain or tightness with lumbar motion to normalize ability to perform household tasks Baseline: Patient reports tightness/pulling with lumbar flexion and rotation 06/09/2021 INITIAL  5 Patient will report no limitation with sitting or standing extended periods Baseline: patient reports  limitations with sitting and standing due to pain 06/09/2021 INITIAL     PLAN: PT FREQUENCY: 1x/week   PT DURATION: 8 weeks   PLANNED INTERVENTIONS: Therapeutic exercises, Therapeutic activity, Neuro Muscular re-education, Balance training, Gait training, Patient/Family education, Joint mobilization, Stair training, Aquatic Therapy, Dry Needling, Electrical stimulation, Spinal mobilization, Cryotherapy, Moist heat, Taping, Vasopneumatic device, Traction, Ultrasound, Ionotophoresis 4mg /ml Dexamethasone, and Manual therapy  PLAN FOR NEXT SESSION: Review HEP and progress PRN, continue stretching for lumbar (child's pose), progress core and hip strengthening, lifting mechanics   Hilda Blades, PT, DPT, LAT, ATC 04/30/21  11:30 AM Phone: [phone] Fax: [phone]

## 2021-05-05 ENCOUNTER — Other Ambulatory Visit: Payer: Self-pay

## 2021-05-05 ENCOUNTER — Encounter: Payer: Self-pay | Admitting: Physical Therapy

## 2021-05-05 ENCOUNTER — Ambulatory Visit: Payer: Medicaid Other | Attending: Neurological Surgery | Admitting: Physical Therapy

## 2021-05-05 DIAGNOSIS — M545 Low back pain, unspecified: Secondary | ICD-10-CM | POA: Diagnosis not present

## 2021-05-05 DIAGNOSIS — M6281 Muscle weakness (generalized): Secondary | ICD-10-CM | POA: Diagnosis not present

## 2021-05-05 NOTE — Therapy (Signed)
OUTPATIENT PHYSICAL THERAPY TREATMENT NOTE   Patient Name: Jill Ray MRN: [id number] DOB:04/12/1981, 41 y.o., female Today's Date: 05/05/2021  PCP: Patient, No Pcp Per (Inactive) REFERRING PROVIDER: Eustace Moore, MD   PT End of Session - 05/05/21 1131     Visit Number 3    Number of Visits 8    Date for PT Re-Evaluation 06/09/21    Authorization Type Med Pay / [employer]    Authorization Time Period pending auth    PT Start Time 1130    PT Stop Time 1210    PT Time Calculation (min) 40 min    Activity Tolerance Patient tolerated treatment well    Behavior During Therapy [facility] for tasks assessed/performed              Past Medical History:  Diagnosis Date   Anemia    takes Iron supplements   Asthma    dust, exhaustion, exercise trigger attacks; pt cannot remember when she last used inhaler   Diabetes mellitus without complication ([place])    Gallstone pancreatitis 08/08/2012   Archie Endo 08/08/2012   Gallstone pancreatitis 08/14/2012   Gestational diabetes    Metformin   Umbilical hernia    hernia (reducible umbilical hernia) is present. Archie Endo 08/08/2012   Urinary tract infection    Past Surgical History:  Procedure Laterality Date   CHOLECYSTECTOMY N/A 08/11/2012   Procedure: LAPAROSCOPIC CHOLECYSTECTOMY;  Surgeon: Rolm Bookbinder, MD;  Location: [place];  Service: General;  Laterality: N/A;   ERCP N/A 08/13/2012   Procedure: ENDOSCOPIC RETROGRADE CHOLANGIOPANCREATOGRAPHY (ERCP);  Surgeon: Jeryl Columbia, MD;  Location: [place];  Service: Endoscopy;  Laterality: N/A;   NO PAST SURGERIES     SPHINCTEROTOMY  08/13/2012   Procedure: SPHINCTEROTOMY;  Surgeon: Jeryl Columbia, MD;  Location: [facility] OR;  Service: Endoscopy;;   Patient Active Problem List   Diagnosis Date Noted   Insertion of Nexplanon 05/16/2018   History of gestational diabetes 12/27/2017   Gestational diabetes 07/10/2016    REFERRING PROVIDER: Eustace Moore, MD   REFERRING DIAG: Low back pain,  unspecified  ONSET DATE: 03/18/2021  THERAPY DIAG:  Acute bilateral low back pain, unspecified whether sciatica present  Muscle weakness (generalized)  PERTINENT HISTORY: N/A  PRECAUTIONS: None WEIGHT BEARING RESTRICTIONS: No   SUBJECTIVE: Patient reports she is doing well, no new issues. She has been working on exercises and feel they are still helping.  PAIN:  Are you having pain? Yes VAS scale: 4/10 Pain location: Lower back Pain orientation: Bilateral and Lower  PAIN TYPE: Chronic, intermittent Pain description:  tightness, aching   Aggravating factors: standing extended periods Relieving factors: Stretches, rest   OBJECTIVE:  PATIENT SURVEYS:  Modified Oswestry 10/50 (last assessed 04/14/2021)   LUMBAR AROM   AROM  04/14/2021  04/30/2021  Flexion WFL WFL  Extension WFL   Right lateral flexion WFL   Left lateral flexion WFL   Right rotation [facility]  Left rotation [facility]   Patient reported increased pulling of lower back with lumbar flexion and rotation   LE MMT:   MMT Right 04/14/2021 Left 04/14/2021 Right/Left 05/05/2020  Hip flexion 4 4-   Hip extension 4- 4- 4-/4-  Hip abduction 4- 4-     TODAY'S TREATMENT  05/05/2021: Recumbent bike L2 x 5 min while taking subejctive  LTR x 10 Piriformis stretch 2 x 30 sec each Bridge 2 x 10 with 3 sec hold 90-90 isometric hold 2 x 6 with  5 sec hold Sidelying hip abduction 2 x 10 each Sit to stand holding 10# at chest 2 x 10, x 10 without UE support for HEP demo Row with black 2 x 10 - cued for abdominal engagement Deadlift from 6" with 15# 2 x 10 - cues require for technique to maintain lumbopelvic control, avoiding excessive lumbar lordosis Seated lumbar flexion stretch with physioball 3 x 5 sec hold   04/30/21: NuSTep L5 x 5 min with UE/LE while taking subejctive  LTR x 10 Piriformis stretch 2 x 30 sec each Sidelying thoracic rotation x 10 each Sidelying hip abduction 2 x 10 each Bridge 2 x 10 with  3 sec hold Sit to stand holding 10# at chest 2 x 10 Row with black 2 x 10 Deadlift from 6" with 15# 2 x 10 - cues require for technique to maintain lumbopelvic control   Eval: LTR 3 x 10 sec Piriformis stretch x 30 sec each Bridge x 5 SLR x 5 each Sidelying hip abduction x 5 each Seated hamstring stretch x 30 sec each Seated shoulder blade squeezes 5 x 5 sec hold   PATIENT EDUCATION:  Education details: HEP update Person educated: Patient Education method: Consulting civil engineer, Media planner, Verbal cues, Handout Education comprehension: verbalized understanding, returned demonstration, verbal cues required, and needs further education   HOME EXERCISE PROGRAM: Access Code: [email or web address]    ASSESSMENT: CLINICAL IMPRESSION: Patient tolerated therapy well with no adverse effects. Therapy continues to focus on progress core strength and control. Patient is progressing well with therapy but does continue to require cueing for proper lifting technique and avoiding excessive lumbar lordosis. She demonstrated fatigue when performing her standing exercises and cued for proper pelvic control. She did report slight low back discomfort with lifting but stated it was not pain. Updated HEP to continue strengthening progression for home with good tolerance. Patient would benefit from continued skilled PT to progress mobility and strength in order to reduce pain and maximize functional ability.   GOALS: Goals reviewed with patient? Yes   SHORT TERM GOALS:   STG Name Target Date Goal status  1 Patient will be I with initial HEP in order to progress with therapy. Baseline: HEP provided at eval 05/12/2020 INITIAL  2 Patient will report pain level </= 3/10 to reduce functional limitations with household tasks Baseline: Patient reports current pain 5/10, can get up to 6-7/10 05/12/2021 INITIAL  3 Patient will demonstrate appropriate lifting mechanics to reduce low back pain with work Baseline: Patient exhibits  excessive lumbar flexion with lifting 05/12/2021 INITIAL    LONG TERM GOALS:    LTG Name Target Date Goal status  1 Patient will be I with final HEP to maintain progress from PT. Baseline: HEP provided at eval 06/09/2021 INITIAL  2 Patient will report </= 2/50 on Modified ODI in order to indicate improved functional ability Baseline:10/50 06/09/2021 INITIAL  3 Patient will demonstrate hip strength >/= 4/5 MMT in order to improve standing tolerance Baseline: grossly 4-/5 MMT 06/09/2021 INITIAL  4 Patient will report no increase in pain or tightness with lumbar motion to normalize ability to perform household tasks Baseline: Patient reports tightness/pulling with lumbar flexion and rotation 06/09/2021 INITIAL  5 Patient will report no limitation with sitting or standing extended periods Baseline: patient reports limitations with sitting and standing due to pain 06/09/2021 INITIAL     PLAN: PT FREQUENCY: 1x/week   PT DURATION: 8 weeks   PLANNED INTERVENTIONS: Therapeutic exercises, Therapeutic activity, Neuro Muscular re-education, Balance training,  Gait training, Patient/Family education, Joint mobilization, Stair training, Aquatic Therapy, Dry Needling, Electrical stimulation, Spinal mobilization, Cryotherapy, Moist heat, Taping, Vasopneumatic device, Traction, Ultrasound, Ionotophoresis 4mg /ml Dexamethasone, and Manual therapy  PLAN FOR NEXT SESSION: Review HEP and progress PRN, continue stretching for lumbar (child's pose), progress core and hip strengthening, lifting mechanics   Hilda Blades, PT, DPT, LAT, ATC 05/05/21  12:11 PM Phone: [phone] Fax: [phone]

## 2021-05-05 NOTE — Patient Instructions (Signed)
Access Code: [email or web address] URL: [email or web address] Date: 05/05/2021 Prepared by: Hilda Blades  Exercises Supine Lower Trunk Rotation - 1-2 x daily - 7 x weekly - 5 reps - 10 seconds hold Supine Piriformis Stretch with Foot on Ground - 1-2 x daily - 7 x weekly - 3 reps - 30 seconds hold Bridge - 1 x daily - 7 x weekly - 2 sets - 10 reps Straight Leg Raise - 1 x daily - 7 x weekly - 2 sets - 10 reps Supine 90/90 Abdominal Bracing - 1 x daily - 7 x weekly - 3 sets - 5 reps - 5 seconds hold Sidelying Hip Abduction - 1 x daily - 7 x weekly - 2 sets - 10 reps Sit to Stand Without Arm Support - 1 x daily - 7 x weekly - 2 sets - 10 reps Seated Hamstring Stretch - 1 x daily - 7 x weekly - 3 sets - 3 reps - 30 seconds hold Seated Scapular Retraction - 1 x daily - 7 x weekly - 2 sets - 5 reps - 3 seconds hold

## 2021-05-12 ENCOUNTER — Encounter: Payer: Self-pay | Admitting: Physical Therapy

## 2021-05-12 ENCOUNTER — Ambulatory Visit: Payer: Medicaid Other | Admitting: Physical Therapy

## 2021-05-12 ENCOUNTER — Other Ambulatory Visit: Payer: Self-pay

## 2021-05-12 DIAGNOSIS — M6281 Muscle weakness (generalized): Secondary | ICD-10-CM

## 2021-05-12 DIAGNOSIS — M545 Low back pain, unspecified: Secondary | ICD-10-CM | POA: Diagnosis not present

## 2021-05-12 NOTE — Therapy (Signed)
OUTPATIENT PHYSICAL THERAPY TREATMENT NOTE   Patient Name: Jill Ray MRN: [id number] DOB:March 18, 1981, 41 y.o., female Today's Date: 05/12/2021  PCP: Patient, No Pcp Per (Inactive) REFERRING PROVIDER: Eustace Moore, MD   PT End of Session - 05/12/21 1141     Visit Number 4    Number of Visits 8    Date for PT Re-Evaluation 06/09/21    Authorization Type Med Pay / [employer]    Authorization Time Period 04/21/21 - 05/30/21    Authorization - Visit Number 3    Authorization - Number of Visits 8    PT Start Time 1140   patient arrived late   PT Stop Time 1218    PT Time Calculation (min) 38 min    Activity Tolerance Patient tolerated treatment well    Behavior During Therapy [facility] for tasks assessed/performed               Past Medical History:  Diagnosis Date   Anemia    takes Iron supplements   Asthma    dust, exhaustion, exercise trigger attacks; pt cannot remember when she last used inhaler   Diabetes mellitus without complication ([place])    Gallstone pancreatitis 08/08/2012   Archie Endo 08/08/2012   Gallstone pancreatitis 08/14/2012   Gestational diabetes    Metformin   Umbilical hernia    hernia (reducible umbilical hernia) is present. Archie Endo 08/08/2012   Urinary tract infection    Past Surgical History:  Procedure Laterality Date   CHOLECYSTECTOMY N/A 08/11/2012   Procedure: LAPAROSCOPIC CHOLECYSTECTOMY;  Surgeon: Rolm Bookbinder, MD;  Location: [place];  Service: General;  Laterality: N/A;   ERCP N/A 08/13/2012   Procedure: ENDOSCOPIC RETROGRADE CHOLANGIOPANCREATOGRAPHY (ERCP);  Surgeon: Jeryl Columbia, MD;  Location: [place];  Service: Endoscopy;  Laterality: N/A;   NO PAST SURGERIES     SPHINCTEROTOMY  08/13/2012   Procedure: SPHINCTEROTOMY;  Surgeon: Jeryl Columbia, MD;  Location: [facility] OR;  Service: Endoscopy;;   Patient Active Problem List   Diagnosis Date Noted   Insertion of Nexplanon 05/16/2018   History of gestational diabetes 12/27/2017   Gestational  diabetes 07/10/2016    REFERRING PROVIDER: Eustace Moore, MD   REFERRING DIAG: Low back pain, unspecified  ONSET DATE: 03/18/2021  THERAPY DIAG:  Acute bilateral low back pain, unspecified whether sciatica present  Muscle weakness (generalized)  PERTINENT HISTORY: N/A  PRECAUTIONS: None WEIGHT BEARING RESTRICTIONS: No   SUBJECTIVE: Patient reports she continues to do well and is independent with her current HEP.   PAIN:  Are you having pain? Yes VAS scale: 3/10 Pain location: Lower back Pain orientation: Bilateral and Lower  PAIN TYPE: Chronic, intermittent Pain description:  tightness, aching   Aggravating factors: standing extended periods Relieving factors: Stretches, rest   OBJECTIVE: (BOLDED MEASURES ASSESSED THIS VISIT) PATIENT SURVEYS:  Modified Oswestry 10/50 (last assessed 04/14/2021)   LUMBAR AROM   AROM  04/14/2021  04/30/2021  05/12/2020  Flexion WFL WFL WFL  Extension WFL    Right lateral flexion WFL    Left lateral flexion WFL    Right rotation [facility] WFL  Left rotation [facility]   Patient reported NO increased pain with lumbar motion   LE MMT:   MMT Right 04/14/2021 Left 04/14/2021 Right/Left 05/05/2020  Hip flexion 4 4-   Hip extension 4- 4- 4-/4-  Hip abduction 4- 4-     TODAY'S TREATMENT  05/12/2021 LTR 3 x 10 seconds each in figure-4 position Piriformis  stretch x 20 sec each Bridge x 10 with 3 sec hold 90-90 isometric abdominal hold 2 x 6 with 5 sec hold - lifting/lowering one leg at a time Springboard bar pull-down with yellow springs 2 x 10 Springboard pallof press with purple springs 2 x 10 each Deadlift from 8" with 25# 2 x 10 - cues require for technique to maintain lumbopelvic control, avoiding excessive lumbar lordosis Sit to stand with 5# with forward press 2 x 10 Sidelying hip abduction 2 x 10 each Seated lumbar flexion stretch with physioball 2 x 3 with 5 sec hold Seated hamstring stretch 2 x 20  sec  05/05/2021: Recumbent bike L2 x 5 min while taking subejctive  LTR x 10 Piriformis stretch 2 x 30 sec each Bridge 2 x 10 with 3 sec hold 90-90 isometric hold 2 x 6 with 5 sec hold Sidelying hip abduction 2 x 10 each Sit to stand holding 10# at chest 2 x 10, x 10 without UE support for [employer] with black 2 x 10 - cued for abdominal engagement Deadlift from 6" with 15# 2 x 10 - cues require for technique to maintain lumbopelvic control, avoiding excessive lumbar lordosis Seated lumbar flexion stretch with physioball 3 x 5 sec hold  04/30/21: NuSTep L5 x 5 min with UE/LE while taking subejctive  LTR x 10 Piriformis stretch 2 x 30 sec each Sidelying thoracic rotation x 10 each Sidelying hip abduction 2 x 10 each Bridge 2 x 10 with 3 sec hold Sit to stand holding 10# at chest 2 x 10 Row with black 2 x 10 Deadlift from 6" with 15# 2 x 10 - cues require for technique to maintain lumbopelvic control   PATIENT EDUCATION:  Education details: HEP Person educated: Patient Education method: Consulting civil engineer, Media planner, Verbal cues Education comprehension: verbalized understanding, returned demonstration, verbal cues required, and needs further education   HOME EXERCISE PROGRAM: Access Code: [email or web address]    ASSESSMENT: CLINICAL IMPRESSION: Patient tolerated therapy well with no adverse effects. Therapy continues to focus on progressing core/hip strength and lumbopelvic control with activity and lifting. She seems to be progressing well in therapy and tolerating increased levels of difficulty and resistance. She did not report any increase in pain this visit but continues to have muscular endurance deficit with exercise and requires cueing to avoid excessive lumbar extension. No changes made to HEP this visit. Patient would benefit from continued skilled PT to progress mobility and strength in order to reduce pain and maximize functional ability   GOALS: Goals reviewed with patient?  Yes   SHORT TERM GOALS:   STG Name Target Date Goal status  1 Patient will be I with initial HEP in order to progress with therapy. Baseline: she reports independence with HEP - 05/12/2021 05/12/2020 ACHIEVED  2 Patient will report pain level </= 3/10 to reduce functional limitations with household tasks Baseline: patient reports 3/10 pain - 05/12/2021 05/12/2021 ACHIEVED  3 Patient will demonstrate appropriate lifting mechanics to reduce low back pain with work Baseline: Patient continues to require cueing to avoid excessive lumbar extension - 05/12/2021 05/12/2021 ONGOING    LONG TERM GOALS:    LTG Name Target Date Goal status  1 Patient will be I with final HEP to maintain progress from PT. Baseline: HEP provided at eval 06/09/2021 INITIAL  2 Patient will report </= 2/50 on Modified ODI in order to indicate improved functional ability Baseline:10/50 06/09/2021 INITIAL  3 Patient will demonstrate hip strength >/= 4/5 MMT  in order to improve standing tolerance Baseline: grossly 4-/5 MMT 06/09/2021 INITIAL  4 Patient will report no increase in pain or tightness with lumbar motion to normalize ability to perform household tasks Baseline: Patient reports tightness/pulling with lumbar flexion and rotation 06/09/2021 INITIAL  5 Patient will report no limitation with sitting or standing extended periods Baseline: patient reports limitations with sitting and standing due to pain 06/09/2021 INITIAL     PLAN: PT FREQUENCY: 1x/week   PT DURATION: 8 weeks   PLANNED INTERVENTIONS: Therapeutic exercises, Therapeutic activity, Neuro Muscular re-education, Balance training, Gait training, Patient/Family education, Joint mobilization, Stair training, Aquatic Therapy, Dry Needling, Electrical stimulation, Spinal mobilization, Cryotherapy, Moist heat, Taping, Vasopneumatic device, Traction, Ultrasound, Ionotophoresis 4mg /ml Dexamethasone, and Manual therapy  PLAN FOR NEXT SESSION: Review HEP and progress PRN,  continue stretching for lumbar (child's pose), progress core and hip strengthening, lifting mechanics   Hilda Blades, PT, DPT, LAT, ATC 05/12/21  12:20 PM Phone: [phone] Fax: [phone]

## 2021-05-20 ENCOUNTER — Ambulatory Visit: Payer: Medicaid Other | Admitting: Physical Therapy

## 2021-05-26 NOTE — Therapy (Addendum)
OUTPATIENT PHYSICAL THERAPY TREATMENT NOTE  DISCHARGE   Patient Name: Jill Ray MRN: [id number] DOB:1980-07-26, 41 y.o., female Today's Date: 05/27/2021  PCP: Patient, No Pcp Per (Inactive) REFERRING PROVIDER: Eustace Moore, MD   PT End of Session - 05/27/21 1055     Visit Number 5    Number of Visits 8    Date for PT Re-Evaluation 06/09/21    Authorization Type Med Pay / [employer]    Authorization Time Period 04/21/21 - 05/30/21    Authorization - Visit Number 4    Authorization - Number of Visits 8    PT Start Time 3419    PT Stop Time 1115   patient arrived late and requested to leave early for class   PT Time Calculation (min) 23 min    Activity Tolerance Patient tolerated treatment well    Behavior During Therapy [facility] for tasks assessed/performed                Past Medical History:  Diagnosis Date   Anemia    takes Iron supplements   Asthma    dust, exhaustion, exercise trigger attacks; pt cannot remember when she last used inhaler   Diabetes mellitus without complication ([place])    Gallstone pancreatitis 08/08/2012   Archie Endo 08/08/2012   Gallstone pancreatitis 08/14/2012   Gestational diabetes    Metformin   Umbilical hernia    hernia (reducible umbilical hernia) is present. Archie Endo 08/08/2012   Urinary tract infection    Past Surgical History:  Procedure Laterality Date   CHOLECYSTECTOMY N/A 08/11/2012   Procedure: LAPAROSCOPIC CHOLECYSTECTOMY;  Surgeon: Rolm Bookbinder, MD;  Location: [place];  Service: General;  Laterality: N/A;   ERCP N/A 08/13/2012   Procedure: ENDOSCOPIC RETROGRADE CHOLANGIOPANCREATOGRAPHY (ERCP);  Surgeon: Jeryl Columbia, MD;  Location: [place];  Service: Endoscopy;  Laterality: N/A;   NO PAST SURGERIES     SPHINCTEROTOMY  08/13/2012   Procedure: SPHINCTEROTOMY;  Surgeon: Jeryl Columbia, MD;  Location: [facility] OR;  Service: Endoscopy;;   Patient Active Problem List   Diagnosis Date Noted   Insertion of Nexplanon 05/16/2018   History  of gestational diabetes 12/27/2017   Gestational diabetes 07/10/2016    REFERRING PROVIDER: Eustace Moore, MD   REFERRING DIAG: Low back pain, unspecified  ONSET DATE: 03/18/2021  THERAPY DIAG:  Acute bilateral low back pain, unspecified whether sciatica present  Muscle weakness (generalized)  PERTINENT HISTORY: N/A  PRECAUTIONS: None WEIGHT BEARING RESTRICTIONS: No   SUBJECTIVE: Patient reports she is doing well, she has been working on her exercises more at home and feels they are helping. She does continues to have pain with extended periods of standing or sitting, and with heavy or continuous lifting activities.  PAIN:  Are you having pain? Yes VAS scale: 3/10 Pain location: Lower back Pain orientation: Bilateral and Lower  PAIN TYPE: Chronic, intermittent Pain description:  tightness, aching   Aggravating factors: standing extended periods Relieving factors: Stretches, rest  PATIENT GOALS Pain relief and get back to previous activity level   OBJECTIVE: (BOLDED MEASURES ASSESSED THIS VISIT) PATIENT SURVEYS:  Modified ODI: 3/50   LUMBAR AROM   AROM  04/14/2021  05/12/2020  05/27/2021  Flexion WFL WFL WFL  Extension WFL  WFL  Right lateral flexion WFL  WFL  Left lateral flexion [facility]  WFL  Right rotation [facility] WFL  Left rotation [facility] WFL   Patient reported NO increased pain with lumbar motion   LE  MMT:   MMT Right 04/14/2021 Left 04/14/2021 Right/Left 05/05/2020 Right / Left 05/27/2021  Hip flexion 4 4-    Hip extension 4- 4- 4-/4- 4- / 4-  Hip abduction 4- 4-  4- / 4-    TODAY'S TREATMENT  05/27/2021: Recumbent bike L2 x 4 min while taking subjective LTR 3 x 10 seconds each in figure-4 position Piriformis stretch x 30 sec each Bridge 10 x 3 sec SLR x 10 each 90-90 isometric abdominal hold 2 x 5 with 5 sec hold - lifting/lowering one leg at a time Sidelying hip abduction x 10 each Sit to stand without UE support 2 x 10   05/12/2021 LTR 3  x 10 seconds each in figure-4 position Piriformis stretch x 20 sec each Bridge x 10 with 3 sec hold 90-90 isometric abdominal hold 2 x 6 with 5 sec hold - lifting/lowering one leg at a time Springboard bar pull-down with yellow springs 2 x 10 Springboard pallof press with purple springs 2 x 10 each Deadlift from 8" with 25# 2 x 10 - cues require for technique to maintain lumbopelvic control, avoiding excessive lumbar lordosis Sit to stand with 5# with forward press 2 x 10 Sidelying hip abduction 2 x 10 each Seated lumbar flexion stretch with physioball 2 x 3 with 5 sec hold Seated hamstring stretch 2 x 20 sec  05/05/2021: Recumbent bike L2 x 5 min while taking subejctive  LTR x 10 Piriformis stretch 2 x 30 sec each Bridge 2 x 10 with 3 sec hold 90-90 isometric hold 2 x 6 with 5 sec hold Sidelying hip abduction 2 x 10 each Sit to stand holding 10# at chest 2 x 10, x 10 without UE support for [employer] with black 2 x 10 - cued for abdominal engagement Deadlift from 6" with 15# 2 x 10 - cues require for technique to maintain lumbopelvic control, avoiding excessive lumbar lordosis Seated lumbar flexion stretch with physioball 3 x 5 sec hold   PATIENT EDUCATION:  Education details: HEP consistency Person educated: Patient Education method: Consulting civil engineer, Demonstration, Verbal cues Education comprehension: verbalized understanding, returned demonstration, verbal cues required, and needs further education   HOME EXERCISE PROGRAM: Access Code: [email or web address]    ASSESSMENT: CLINICAL IMPRESSION: Patient tolerated therapy well with no adverse effects. Therapy was shortened due to patient arriving late and needing to leave early for her class. This visit she demonstrates lumbar motion gross within normal  limits and without an increase in tightness or pain, continues to demonstrate gross core and hip weakness leading to deficits with lumbopelvic control with activities like lifting, continues to  have pain with sitting, standing, and lifting. Patient does report improve functional ability on the modified ODI this visit. Therapy continues to focus on progress her strength and control with good tolerance. No changes to HEP this visit due to time limitations. Overall patient is progressing toward her goals and would benefit from continued skilled PT to progress mobility and strength in order to reduce pain and maximize functional ability.   GOALS: Goals reviewed with patient? Yes   SHORT TERM GOALS:   STG Name Target Date Goal status  1 Patient will be I with initial HEP in order to progress with therapy. Baseline: she reports independence with HEP - 05/27/2021 05/12/2020 ACHIEVED  2 Patient will report pain level </= 3/10 to reduce functional limitations with household tasks Baseline: patient reports 3/10 pain - 05/27/2021 05/12/2021 ACHIEVED  3 Patient will demonstrate appropriate lifting mechanics  to reduce low back pain with work Baseline: Patient continues to require cueing to avoid excessive lumbar extension - 05/27/2021 05/12/2021 ONGOING    LONG TERM GOALS:    LTG Name Target Date Goal status  1 Patient will be I with final HEP to maintain progress from PT. Baseline: progressing HEP for core/hip strengthening - 05/27/2021 06/09/2021 ONGOING  2 Patient will report </= 2/50 on Modified ODI in order to indicate improved functional ability Baseline: Modified ODI 3/50 - 05/27/2021 (10/50 on eval) 06/09/2021 ONGOING  3 Patient will demonstrate hip strength >/= 4/5 MMT in order to improve standing tolerance Baseline: grossly 4-/5 MMT - 05/27/2021 06/09/2021 ONGOING  4 Patient will report no increase in pain or tightness with lumbar motion to normalize ability to perform household tasks Baseline: Patient reports no increase pain or tightness with active lumbar motion - 05/27/2021 06/09/2021 ACHIEVED  5 Patient will report no limitation with sitting or standing extended periods Baseline: patient  reports limitations with extended periods of sitting and standing due to pain 06/09/2021 ONGOING     PLAN: PT FREQUENCY: 1x/week   PT DURATION: 8 weeks   PLANNED INTERVENTIONS: Therapeutic exercises, Therapeutic activity, Neuro Muscular re-education, Balance training, Gait training, Patient/Family education, Joint mobilization, Stair training, Aquatic Therapy, Dry Needling, Electrical stimulation, Spinal mobilization, Cryotherapy, Moist heat, Taping, Vasopneumatic device, Traction, Ultrasound, Ionotophoresis 53m/ml Dexamethasone, and Manual therapy  PLAN FOR NEXT SESSION: Review HEP and progress PRN, continue stretching for lumbar (child's pose), progress core and hip strengthening, lifting mechanics   CHilda Blades PT, DPT, LAT, ATC 05/27/21  11:23 AM Phone: [phone]: [phone]   PHYSICAL THERAPY DISCHARGE SUMMARY  Visits from Start of Care: 5  Current functional level related to goals / functional outcomes: See above   Remaining deficits: See above   Education / Equipment: HEP   Patient agrees to discharge. Patient goals were partially met. Patient is being discharged due to not returning since the last visit.

## 2021-05-27 ENCOUNTER — Ambulatory Visit: Payer: Medicaid Other | Admitting: Physical Therapy

## 2021-05-27 ENCOUNTER — Other Ambulatory Visit: Payer: Self-pay

## 2021-05-27 ENCOUNTER — Encounter: Payer: Self-pay | Admitting: Physical Therapy

## 2021-05-27 DIAGNOSIS — M6281 Muscle weakness (generalized): Secondary | ICD-10-CM

## 2021-05-27 DIAGNOSIS — M545 Low back pain, unspecified: Secondary | ICD-10-CM

## 2021-06-09 ENCOUNTER — Ambulatory Visit: Payer: No Typology Code available for payment source | Admitting: Physical Therapy

## 2021-06-09 ENCOUNTER — Telehealth: Payer: Self-pay | Admitting: Physical Therapy

## 2021-06-09 NOTE — Telephone Encounter (Signed)
Attempted to contact patient due to missed PT appointment. No answer and unable to leave VM.  Hilda Blades, PT, DPT, LAT, ATC 06/09/21  9:41 AM Phone: [phone] Fax: [phone]

## 2021-06-09 NOTE — Therapy (Incomplete)
OUTPATIENT PHYSICAL THERAPY TREATMENT NOTE   Patient Name: Jill Ray MRN: [id number] DOB:Jul 30, 1980, 41 y.o., female Today's Date: 06/09/2021  PCP: Patient, No Pcp Per (Inactive) REFERRING PROVIDER: Eustace Moore, MD        Past Medical History:  Diagnosis Date   Anemia    takes Iron supplements   Asthma    dust, exhaustion, exercise trigger attacks; pt cannot remember when she last used inhaler   Diabetes mellitus without complication ([place])    Gallstone pancreatitis 08/08/2012   Archie Endo 08/08/2012   Gallstone pancreatitis 08/14/2012   Gestational diabetes    Metformin   Umbilical hernia    hernia (reducible umbilical hernia) is present. Archie Endo 08/08/2012   Urinary tract infection    Past Surgical History:  Procedure Laterality Date   CHOLECYSTECTOMY N/A 08/11/2012   Procedure: LAPAROSCOPIC CHOLECYSTECTOMY;  Surgeon: Rolm Bookbinder, MD;  Location: [place];  Service: General;  Laterality: N/A;   ERCP N/A 08/13/2012   Procedure: ENDOSCOPIC RETROGRADE CHOLANGIOPANCREATOGRAPHY (ERCP);  Surgeon: Jeryl Columbia, MD;  Location: [place];  Service: Endoscopy;  Laterality: N/A;   NO PAST SURGERIES     SPHINCTEROTOMY  08/13/2012   Procedure: SPHINCTEROTOMY;  Surgeon: Jeryl Columbia, MD;  Location: [facility] OR;  Service: Endoscopy;;   Patient Active Problem List   Diagnosis Date Noted   Insertion of Nexplanon 05/16/2018   History of gestational diabetes 12/27/2017   Gestational diabetes 07/10/2016    REFERRING PROVIDER: Eustace Moore, MD   REFERRING DIAG: Low back pain, unspecified  ONSET DATE: 03/18/2021  THERAPY DIAG:  No diagnosis found.  PERTINENT HISTORY: N/A  PRECAUTIONS: None WEIGHT BEARING RESTRICTIONS: No   SUBJECTIVE: Patient reports she is doing well, she has been working on her exercises more at home and feels they are helping. She does continues to have pain with extended periods of standing or sitting, and with heavy or continuous lifting activities.  PAIN:   Are you having pain? Yes VAS scale: 3/10 Pain location: Lower back Pain orientation: Bilateral and Lower  PAIN TYPE: Chronic, intermittent Pain description:  tightness, aching   Aggravating factors: standing extended periods Relieving factors: Stretches, rest  PATIENT GOALS Pain relief and get back to previous activity level   OBJECTIVE: (BOLDED MEASURES ASSESSED THIS VISIT) PATIENT SURVEYS:  Modified ODI: 3/50   LUMBAR AROM   AROM  04/14/2021  05/12/2020  05/27/2021  Flexion WFL WFL WFL  Extension [facility]  WFL  Right lateral flexion [facility]  WFL  Left lateral flexion [facility]  WFL  Right rotation [facility] WFL  Left rotation [facility] WFL   Patient reported NO increased pain with lumbar motion   LE MMT:   MMT Right 04/14/2021 Left 04/14/2021 Right/Left 05/05/2020 Right / Left 05/27/2021  Hip flexion 4 4-    Hip extension 4- 4- 4-/4- 4- / 4-  Hip abduction 4- 4-  4- / 4-    TODAY'S TREATMENT  05/27/2021: Recumbent bike L2 x 4 min while taking subjective LTR 3 x 10 seconds each in figure-4 position Piriformis stretch x 30 sec each Bridge 10 x 3 sec SLR x 10 each 90-90 isometric abdominal hold 2 x 5 with 5 sec hold - lifting/lowering one leg at a time Sidelying hip abduction x 10 each Sit to stand without UE support 2 x 10   05/12/2021 LTR 3 x 10 seconds each in figure-4 position Piriformis stretch x 20 sec each Bridge x 10 with 3 sec hold 90-90 isometric abdominal hold  2 x 6 with 5 sec hold - lifting/lowering one leg at a time Springboard bar pull-down with yellow springs 2 x 10 Springboard pallof press with purple springs 2 x 10 each Deadlift from 8" with 25# 2 x 10 - cues require for technique to maintain lumbopelvic control, avoiding excessive lumbar lordosis Sit to stand with 5# with forward press 2 x 10 Sidelying hip abduction 2 x 10 each Seated lumbar flexion stretch with physioball 2 x 3 with 5 sec hold Seated hamstring stretch 2 x 20 sec  05/05/2021: Recumbent bike  L2 x 5 min while taking subejctive  LTR x 10 Piriformis stretch 2 x 30 sec each Bridge 2 x 10 with 3 sec hold 90-90 isometric hold 2 x 6 with 5 sec hold Sidelying hip abduction 2 x 10 each Sit to stand holding 10# at chest 2 x 10, x 10 without UE support for [employer] with black 2 x 10 - cued for abdominal engagement Deadlift from 6" with 15# 2 x 10 - cues require for technique to maintain lumbopelvic control, avoiding excessive lumbar lordosis Seated lumbar flexion stretch with physioball 3 x 5 sec hold   PATIENT EDUCATION:  Education details: HEP consistency Person educated: Patient Education method: Consulting civil engineer, Demonstration, Verbal cues Education comprehension: verbalized understanding, returned demonstration, verbal cues required, and needs further education   HOME EXERCISE PROGRAM: Access Code: [email or web address]    ASSESSMENT: CLINICAL IMPRESSION: Patient tolerated therapy well with no adverse effects. Therapy was shortened due to patient arriving late and needing to leave early for her class. This visit she demonstrates lumbar motion gross within normal  limits and without an increase in tightness or pain, continues to demonstrate gross core and hip weakness leading to deficits with lumbopelvic control with activities like lifting, continues to have pain with sitting, standing, and lifting. Patient does report improve functional ability on the modified ODI this visit. Therapy continues to focus on progress her strength and control with good tolerance. No changes to HEP this visit due to time limitations. Overall patient is progressing toward her goals and would benefit from continued skilled PT to progress mobility and strength in order to reduce pain and maximize functional ability.   GOALS: Goals reviewed with patient? Yes   SHORT TERM GOALS:   STG Name Target Date Goal status  1 Patient will be I with initial HEP in order to progress with therapy. Baseline: she reports  independence with HEP - 05/27/2021 05/12/2020 ACHIEVED  2 Patient will report pain level </= 3/10 to reduce functional limitations with household tasks Baseline: patient reports 3/10 pain - 05/27/2021 05/12/2021 ACHIEVED  3 Patient will demonstrate appropriate lifting mechanics to reduce low back pain with work Baseline: Patient continues to require cueing to avoid excessive lumbar extension - 05/27/2021 05/12/2021 ONGOING    LONG TERM GOALS:    LTG Name Target Date Goal status  1 Patient will be I with final HEP to maintain progress from PT. Baseline: progressing HEP for core/hip strengthening - 05/27/2021 06/09/2021 ONGOING  2 Patient will report </= 2/50 on Modified ODI in order to indicate improved functional ability Baseline: Modified ODI 3/50 - 05/27/2021 (10/50 on eval) 06/09/2021 ONGOING  3 Patient will demonstrate hip strength >/= 4/5 MMT in order to improve standing tolerance Baseline: grossly 4-/5 MMT - 05/27/2021 06/09/2021 ONGOING  4 Patient will report no increase in pain or tightness with lumbar motion to normalize ability to perform household tasks Baseline: Patient reports no increase pain or  tightness with active lumbar motion - 05/27/2021 06/09/2021 ACHIEVED  5 Patient will report no limitation with sitting or standing extended periods Baseline: patient reports limitations with extended periods of sitting and standing due to pain 06/09/2021 ONGOING     PLAN: PT FREQUENCY: 1x/week   PT DURATION: 8 weeks   PLANNED INTERVENTIONS: Therapeutic exercises, Therapeutic activity, Neuro Muscular re-education, Balance training, Gait training, Patient/Family education, Joint mobilization, Stair training, Aquatic Therapy, Dry Needling, Electrical stimulation, Spinal mobilization, Cryotherapy, Moist heat, Taping, Vasopneumatic device, Traction, Ultrasound, Ionotophoresis 4mg /ml Dexamethasone, and Manual therapy  PLAN FOR NEXT SESSION: Review HEP and progress PRN, continue stretching for lumbar (child's  pose), progress core and hip strengthening, lifting mechanics   Hilda Blades, PT, DPT, LAT, ATC 06/09/21  8:47 AM Phone: [phone] Fax: [phone]

## 2021-07-01 ENCOUNTER — Encounter (HOSPITAL_COMMUNITY): Payer: Self-pay | Admitting: Radiology

## 2021-11-19 ENCOUNTER — Encounter: Payer: Self-pay | Admitting: Internal Medicine

## 2021-12-28 ENCOUNTER — Ambulatory Visit (INDEPENDENT_AMBULATORY_CARE_PROVIDER_SITE_OTHER): Payer: Medicaid Other | Admitting: Internal Medicine

## 2021-12-28 ENCOUNTER — Encounter: Payer: Self-pay | Admitting: Internal Medicine

## 2021-12-28 VITALS — BP 116/68 | HR 97 | Ht 64.0 in | Wt 192.0 lb

## 2021-12-28 DIAGNOSIS — K625 Hemorrhage of anus and rectum: Secondary | ICD-10-CM

## 2021-12-28 DIAGNOSIS — R140 Abdominal distension (gaseous): Secondary | ICD-10-CM

## 2021-12-28 DIAGNOSIS — R1033 Periumbilical pain: Secondary | ICD-10-CM

## 2021-12-28 DIAGNOSIS — K5900 Constipation, unspecified: Secondary | ICD-10-CM

## 2021-12-28 MED ORDER — NA SULFATE-K SULFATE-MG SULF 17.5-3.13-1.6 GM/177ML PO SOLN: 1.0000 | Freq: Once | ORAL | 0 refills | Status: AC

## 2021-12-28 NOTE — Patient Instructions (Signed)
_______________________________________________________  If you are age 41 or older, your body mass index should be between 23-30. Your Body mass index is 32.96 kg/m. If this is out of the aforementioned range listed, please consider follow up with your Primary Care Provider.  If you are age 50 or younger, your body mass index should be between 19-25. Your Body mass index is 32.96 kg/m. If this is out of the aformentioned range listed, please consider follow up with your Primary Care Provider.   ________________________________________________________  The  GI providers would like to encourage you to use [facility] to communicate with providers for non-urgent requests or questions.  Due to long hold times on the telephone, sending your provider a message by [facility] may be a faster and more efficient way to get a response.  Please allow 48 business hours for a response.  Please remember that this is for non-urgent requests.  _______________________________________________________    Drink 8 cups of water a day and walk 30 minutes a day.  Please purchase the following medications over the counter and take as directed: Fiber supplement such as Benefiber- use as directed daily Miralax: Take as  directed up to 3 times a day to achieve regular bowel movements  You have been scheduled for a colonoscopy. Please follow written instructions given to you at your visit today.  Please pick up your prep supplies at the pharmacy within the next 1-3 days. If you use inhalers (even only as needed), please bring them with you on the day of your procedure.  You have been scheduled for a CT scan of the abdomen and pelvis at [facility], 1st floor Radiology. You are scheduled on         at         . You should arrive 15 minutes prior to your appointment time for registration.  We are giving you 2 bottles of contrast today that you will need to drink before arriving for the exam. The solution may  taste better if refrigerated so put them in the refrigerator when you get home, but do NOT add ice or any other liquid to this solution as that would dilute it. Shake well before drinking.   Please follow the written instructions below on the day of your exam:   1) Do not eat anything after       (4 hours prior to your test)   2) Drink 1 bottle of contrast @        (2 hours prior to your exam)  Remember to shake well before drinking and do NOT pour over ice.   Drink 1 bottle of contrast @          (1 hour prior to your exam)   You may take any medications as prescribed with a small amount of water, if necessary. If you take any of the following medications: METFORMIN, GLUCOPHAGE, GLUCOVANCE, AVANDAMET, RIOMET, FORTAMET, [place] MET, JANUMET, GLUMETZA or METAGLIP, you MAY be asked to HOLD this medication 48 hours AFTER the exam.   The purpose of you drinking the oral contrast is to aid in the visualization of your intestinal tract. The contrast solution may cause some diarrhea. Depending on your individual set of symptoms, you may also receive an intravenous injection of x-ray contrast/dye. Plan on being at [facility] for 45 minutes or longer, depending on the type of exam you are having performed.   If you have any questions regarding your exam or if you need to  reschedule, you may call [facility] at [phone] between the hours of 8:00 am and 5:00 pm, Monday-Friday.    Due to recent changes in healthcare laws, you may see the results of your imaging and laboratory studies on MyChart before your provider has had a chance to review them.  We understand that in some cases there may be results that are confusing or concerning to you. Not all laboratory results come back in the same time frame and the provider may be waiting for multiple results in order to interpret others.  Please give [place] 48 hours in order for your provider to thoroughly review all the results before contacting the  office for clarification of your results.   It was a pleasure to see you today!  Thank you for trusting me with your gastrointestinal care!

## 2021-12-28 NOTE — Progress Notes (Signed)
Chief Complaint: Bloating, hemorrhoids  HPI : 41 year old female with history of DM and asthma presents with bloating and hemorrhoids  No matter what she does, she will feel bloated all the time. This has been present for years. She is having issues with hemorrhoids. When she has a stool, the stool comes slowly. She has to take apple cider vinegar to make her go to have BM. She will on average have one BM once every 2-3 days. She has always been constipated and feels like it has been getting worse. She endorses rectal bleeding when she is having a BM. Endorses some rectal pain, which she has attributed to hemorrhoids. Denies N&V, weight loss. Endorses pain in the middle of her abdomen. Denies chest burning, regurgitation, dysphagia. Denies family history of GI issues.  Wt Readings from Last 3 Encounters:  12/28/21 192 lb (87.1 kg)  05/16/18 171 lb 6.4 oz (77.7 kg)  04/17/18 191 lb 6.4 oz (86.8 kg)   Past Medical History:  Diagnosis Date   Anemia    takes Iron supplements   Asthma    dust, exhaustion, exercise trigger attacks; pt cannot remember when she last used inhaler   Diabetes mellitus without complication ([place])    Gallstone pancreatitis 08/08/2012   Archie Endo 08/08/2012   Gallstone pancreatitis 08/14/2012   Gestational diabetes    Metformin   Umbilical hernia    hernia (reducible umbilical hernia) is present. Archie Endo 08/08/2012   Urinary tract infection      Past Surgical History:  Procedure Laterality Date   CHOLECYSTECTOMY N/A 08/11/2012   Procedure: LAPAROSCOPIC CHOLECYSTECTOMY;  Surgeon: Rolm Bookbinder, MD;  Location: [place];  Service: General;  Laterality: N/A;   ERCP N/A 08/13/2012   Procedure: ENDOSCOPIC RETROGRADE CHOLANGIOPANCREATOGRAPHY (ERCP);  Surgeon: Jeryl Columbia, MD;  Location: [place];  Service: Endoscopy;  Laterality: N/A;   NO PAST SURGERIES     SPHINCTEROTOMY  08/13/2012   Procedure: SPHINCTEROTOMY;  Surgeon: Jeryl Columbia, MD;  Location: [facility] OR;  Service:  Endoscopy;;   Family History  Problem Relation Age of Onset   Hypotension Mother    Asthma Maternal Grandfather    Other Neg Hx    Stomach cancer Neg Hx    Liver cancer Neg Hx    Social History   Tobacco Use   Smoking status: Never   Smokeless tobacco: Never  Vaping Use   Vaping Use: Never used  Substance Use Topics   Alcohol use: No   Drug use: No   Current Outpatient Medications  Medication Sig Dispense Refill   acetaminophen (TYLENOL) 325 MG tablet Take 650 mg by mouth every 6 (six) hours as needed for mild pain or headache.     albuterol (VENTOLIN HFA) 108 (90 Base) MCG/ACT inhaler Inhale 2 puffs into the lungs every 2 (two) hours as needed for wheezing or shortness of breath.     OVER THE COUNTER MEDICATION Place 1 spray into the nose daily as needed (allergies). Pt states uses son's nasal spray when needed for allergies but unsure of the name of it.     ibuprofen (ADVIL) 600 MG tablet Take 1 tablet (600 mg total) by mouth 3 (three) times daily. 20 tablet 0   No current facility-administered medications for this visit.   Allergies  Allergen Reactions   Levofloxacin Itching   Review of Systems: All systems reviewed and negative except where noted in HPI.   Physical Exam: BP 116/68   Pulse 97   Ht 5'  4" (1.626 m)   Wt 192 lb (87.1 kg)   SpO2 100%   BMI 32.96 kg/m  Constitutional: Pleasant,well-developed, female in no acute distress. HEENT: Normocephalic and atraumatic. Conjunctivae are normal. No scleral icterus. Cardiovascular: Normal rate, regular rhythm.  Pulmonary/chest: Effort normal and breath sounds normal. No wheezing, rales or rhonchi. Abdominal: Soft, nondistended, mildly tender over the umbilicus. Bowel sounds active throughout. There are no masses palpable. No hepatomegaly. Extremities: No edema Neurological: Alert and oriented to person place and time. Skin: Skin is warm and dry. No rashes noted. Psychiatric: Normal mood and affect. Behavior is  normal.  Labs 03/2021: CBC and BMP unremarkable  ERCP 08/13/12:   ASSESSMENT AND PLAN: Bloating Constipation Rectal bleeding Rectal pain Umbilical ab pain Patient presents with bloating that is likely due to underlying constipation. Her underlying constipation could explain worsening of hemorrhoidal bleeding/pain. Will start the patient on constipation therapies and plan for CT A/P and colonoscopy for further evaluation of her ab pain and rectal bleeding. - Drink 8 cups water, walk 30 min per day, increase fiber intake to 25 mg per day - Start daily Miralax - CT A/P w/contrast - Colonoscopy [facility]  Christia Reading, MD

## 2022-01-07 ENCOUNTER — Telehealth: Payer: Self-pay | Admitting: Internal Medicine

## 2022-01-07 NOTE — Telephone Encounter (Signed)
Patient has been given the phone number to call and schedule

## 2022-01-07 NOTE — Addendum Note (Signed)
Addended by: Elias Else on: 01/07/2022 02:27 PM   Modules accepted: Orders

## 2022-01-07 NOTE — Telephone Encounter (Signed)
Good Morning,  I called this patient to reminder her of her procedure coming up on 01-14-22  she stated she would be coming for the procedure. She asked about the CT scan she was suppose to be scheduled for.  I checked the chart and see notes for CT with instructions however not date is listed.    Could you call patient regarding this please.

## 2022-01-10 ENCOUNTER — Encounter: Payer: Self-pay | Admitting: Certified Registered Nurse Anesthetist

## 2022-01-13 ENCOUNTER — Ambulatory Visit: Payer: Medicaid Other | Admitting: Obstetrics and Gynecology

## 2022-01-13 ENCOUNTER — Other Ambulatory Visit: Payer: Self-pay | Admitting: Internal Medicine

## 2022-01-13 ENCOUNTER — Ambulatory Visit (HOSPITAL_COMMUNITY)
Admission: RE | Admit: 2022-01-13 | Discharge: 2022-01-13 | Disposition: A | Discharge status: Home / Self Care | Payer: Medicaid Other | Source: Ambulatory Visit | Attending: Internal Medicine | Admitting: Internal Medicine

## 2022-01-13 DIAGNOSIS — N200 Calculus of kidney: Secondary | ICD-10-CM | POA: Diagnosis not present

## 2022-01-13 DIAGNOSIS — K625 Hemorrhage of anus and rectum: Secondary | ICD-10-CM

## 2022-01-13 DIAGNOSIS — K5900 Constipation, unspecified: Secondary | ICD-10-CM | POA: Diagnosis not present

## 2022-01-13 DIAGNOSIS — R1033 Periumbilical pain: Secondary | ICD-10-CM

## 2022-01-13 DIAGNOSIS — R140 Abdominal distension (gaseous): Secondary | ICD-10-CM

## 2022-01-13 DIAGNOSIS — N3289 Other specified disorders of bladder: Secondary | ICD-10-CM | POA: Diagnosis not present

## 2022-01-13 MED ORDER — IOHEXOL 300 MG/ML  SOLN
100.0000 mL | Freq: Once | INTRAMUSCULAR | Status: AC | PRN
  Administered 2022-01-13: 100 mL via INTRAVENOUS

## 2022-01-13 MED ORDER — SODIUM CHLORIDE (PF) 0.9 % IJ SOLN
INTRAMUSCULAR | Status: AC
  Filled 2022-01-13: qty 50

## 2022-01-14 ENCOUNTER — Ambulatory Visit (AMBULATORY_SURGERY_CENTER): Payer: Medicaid Other | Admitting: Internal Medicine

## 2022-01-14 ENCOUNTER — Encounter: Payer: Self-pay | Admitting: Internal Medicine

## 2022-01-14 VITALS — BP 103/70 | HR 88 | Temp 97.8°F | Resp 15 | Ht 64.0 in | Wt 192.0 lb

## 2022-01-14 DIAGNOSIS — D122 Benign neoplasm of ascending colon: Secondary | ICD-10-CM | POA: Diagnosis not present

## 2022-01-14 DIAGNOSIS — K621 Rectal polyp: Secondary | ICD-10-CM | POA: Diagnosis not present

## 2022-01-14 DIAGNOSIS — K625 Hemorrhage of anus and rectum: Secondary | ICD-10-CM | POA: Diagnosis not present

## 2022-01-14 DIAGNOSIS — K648 Other hemorrhoids: Secondary | ICD-10-CM | POA: Diagnosis not present

## 2022-01-14 DIAGNOSIS — D123 Benign neoplasm of transverse colon: Secondary | ICD-10-CM | POA: Diagnosis not present

## 2022-01-14 DIAGNOSIS — J45909 Unspecified asthma, uncomplicated: Secondary | ICD-10-CM | POA: Diagnosis not present

## 2022-01-14 DIAGNOSIS — E119 Type 2 diabetes mellitus without complications: Secondary | ICD-10-CM | POA: Diagnosis not present

## 2022-01-14 MED ORDER — SODIUM CHLORIDE 0.9 % IV SOLN: 500.0000 mL | Freq: Once | INTRAVENOUS | Status: DC

## 2022-01-14 NOTE — Progress Notes (Signed)
GASTROENTEROLOGY PROCEDURE H&P NOTE   Primary Care Physician: Patient, No Pcp Per    Reason for Procedure:   Rectal bleeding  Plan:    Colonoscopy  Patient is appropriate for endoscopic procedure(s) in the ambulatory ([place]) setting.  The nature of the procedure, as well as the risks, benefits, and alternatives were carefully and thoroughly reviewed with the patient. Ample time for discussion and questions allowed. The patient understood, was satisfied, and agreed to proceed.     HPI: Jill Ray is a 41 y.o. female who presents for colonoscopy for evaluation of rectal bleeding .  Patient was most recently seen in the Gastroenterology Clinic on 12/28/21.  No interval change in medical history since that appointment. Please refer to that note for full details regarding GI history and clinical presentation.   Past Medical History:  Diagnosis Date   Anemia    takes Iron supplements   Asthma    dust, exhaustion, exercise trigger attacks; pt cannot remember when she last used inhaler   Diabetes mellitus without complication ([place])    Gallstone pancreatitis 08/08/2012   Archie Endo 08/08/2012   Gallstone pancreatitis 08/14/2012   Gestational diabetes    Metformin   Umbilical hernia    hernia (reducible umbilical hernia) is present. Archie Endo 08/08/2012   Urinary tract infection     Past Surgical History:  Procedure Laterality Date   CHOLECYSTECTOMY N/A 08/11/2012   Procedure: LAPAROSCOPIC CHOLECYSTECTOMY;  Surgeon: Rolm Bookbinder, MD;  Location: [place];  Service: General;  Laterality: N/A;   ERCP N/A 08/13/2012   Procedure: ENDOSCOPIC RETROGRADE CHOLANGIOPANCREATOGRAPHY (ERCP);  Surgeon: Jeryl Columbia, MD;  Location: [place];  Service: Endoscopy;  Laterality: N/A;   NO PAST SURGERIES     SPHINCTEROTOMY  08/13/2012   Procedure: SPHINCTEROTOMY;  Surgeon: Jeryl Columbia, MD;  Location: [facility] OR;  Service: Endoscopy;;    Prior to Admission medications   Medication Sig Start Date End Date  Taking? Authorizing Provider  acetaminophen (TYLENOL) 325 MG tablet Take 650 mg by mouth every 6 (six) hours as needed for mild pain or headache.    [provider]  albuterol (VENTOLIN HFA) 108 (90 Base) MCG/ACT inhaler Inhale 2 puffs into the lungs every 2 (two) hours as needed for wheezing or shortness of breath.    [provider]  ibuprofen (ADVIL) 600 MG tablet Take 1 tablet (600 mg total) by mouth 3 (three) times daily. 03/18/21   Varney Biles, MD  OVER THE COUNTER MEDICATION Place 1 spray into the nose daily as needed (allergies). Pt states uses son's nasal spray when needed for allergies but unsure of the name of it.    [provider]    Current Outpatient Medications  Medication Sig Dispense Refill   acetaminophen (TYLENOL) 325 MG tablet Take 650 mg by mouth every 6 (six) hours as needed for mild pain or headache.     albuterol (VENTOLIN HFA) 108 (90 Base) MCG/ACT inhaler Inhale 2 puffs into the lungs every 2 (two) hours as needed for wheezing or shortness of breath.     ibuprofen (ADVIL) 600 MG tablet Take 1 tablet (600 mg total) by mouth 3 (three) times daily. 20 tablet 0   OVER THE COUNTER MEDICATION Place 1 spray into the nose daily as needed (allergies). Pt states uses son's nasal spray when needed for allergies but unsure of the name of it.     Current Facility-Administered Medications  Medication Dose Route Frequency Provider Last Rate Last Admin  0.9 %  sodium chloride infusion  500 mL Intravenous Once Sharyn Creamer, MD        Allergies as of 01/14/2022 - Review Complete 05/27/2021  Allergen Reaction Noted   Levofloxacin Itching 07/10/2016    Family History  Problem Relation Age of Onset   Hypotension Mother    Asthma Maternal Grandfather    Other Neg Hx    Stomach cancer Neg Hx    Liver cancer Neg Hx     Social History   Socioeconomic History   Marital status: Married    Spouse name: Not on file   Number of children: 4   Years  of education: Not on file   Highest education level: Not on file  Occupational History   Occupation: fulltime student  Tobacco Use   Smoking status: Never   Smokeless tobacco: Never  Vaping Use   Vaping Use: Never used  Substance and Sexual Activity   Alcohol use: No   Drug use: No   Sexual activity: Not Currently  Other Topics Concern   Not on file  Social History Narrative   Not on file   Social Determinants of Health   Financial Resource Strain: Not on file  Food Insecurity: No Food Insecurity (05/16/2018)   Hunger Vital Sign    Worried About Running Out of Food in the Last Year: Never true    Ran Out of Food in the Last Year: Never true  Transportation Needs: No Transportation Needs (05/16/2018)   PRAPARE - Hydrologist (Medical): No    Lack of Transportation (Non-Medical): No  Physical Activity: Not on file  Stress: Not on file  Social Connections: Not on file  Intimate Partner Violence: Not on file    Physical Exam: Vital signs in last 24 hours: There were no vitals taken for this visit. GEN: NAD EYE: Sclerae anicteric ENT: MMM CV: Non-tachycardic Pulm: No increased WOB GI: Soft NEURO:  Alert & Oriented   Christia Reading, MD [facility]   01/14/2022 11:22 AM

## 2022-01-14 NOTE — Patient Instructions (Signed)
Thank you for coming in today! Resume previous diet and medications today. Return to normal daily activities tomorrow. Recommend Anusol HC cream twice per day for 7 days. Return to see Dr Lorenso Courier tin the GI clinic in 6 Weeks.  We will contact you to schedule this appointment.  YOU HAD AN ENDOSCOPIC PROCEDURE TODAY AT [facility]:   Refer to the procedure report that was given to you for any specific questions about what was found during the examination.  If the procedure report does not answer your questions, please call your gastroenterologist to clarify.  If you requested that your care partner not be given the details of your procedure findings, then the procedure report has been included in a sealed envelope for you to review at your convenience later.  YOU SHOULD EXPECT: Some feelings of bloating in the abdomen. Passage of more gas than usual.  Walking can help get rid of the air that was put into your GI tract during the procedure and reduce the bloating. If you had a lower endoscopy (such as a colonoscopy or flexible sigmoidoscopy) you may notice spotting of blood in your stool or on the toilet paper. If you underwent a bowel prep for your procedure, you may not have a normal bowel movement for a few days.  Please Note:  You might notice some irritation and congestion in your nose or some drainage.  This is from the oxygen used during your procedure.  There is no need for concern and it should clear up in a day or so.  SYMPTOMS TO REPORT IMMEDIATELY:  Following lower endoscopy (colonoscopy or flexible sigmoidoscopy):  Excessive amounts of blood in the stool  Significant tenderness or worsening of abdominal pains  Swelling of the abdomen that is new, acute  Fever of 100F or higher   For urgent or emergent issues, a gastroenterologist can be reached at any hour by calling [phone]. Do not use MyChart messaging for urgent concerns.    DIET:  We do recommend a small  meal at first, but then you may proceed to your regular diet.  Drink plenty of fluids but you should avoid alcoholic beverages for 24 hours.  ACTIVITY:  You should plan to take it easy for the rest of today and you should NOT DRIVE or use heavy machinery until tomorrow (because of the sedation medicines used during the test).    FOLLOW UP: Our staff will call the number listed on your records the next business day following your procedure.  We will call around 7:15- 8:00 am to check on you and address any questions or concerns that you may have regarding the information given to you following your procedure. If we do not reach you, we will leave a message.     If any biopsies were taken you will be contacted by phone or by letter within the next 1-3 weeks.  Please call us at [phone] if you have not heard about the biopsies in 3 weeks.    SIGNATURES/CONFIDENTIALITY: You and/or your care partner have signed paperwork which will be entered into your electronic medical record.  These signatures attest to the fact that that the information above on your After Visit Summary has been reviewed and is understood.  Full responsibility of the confidentiality of this discharge information lies with you and/or your care-partner.

## 2022-01-14 NOTE — Op Note (Signed)
[facility] Patient Name: Jill Ray Procedure Date: 01/14/2022 11:18 AM MRN: [id number] Endoscopist: Sonny Masters "Jill Ray ,  Age: 41 Referring MD:  Date of Birth: [birth year] Gender: Female Account #: [id number] Procedure:                Colonoscopy Indications:              Rectal bleeding Medicines:                Monitored Anesthesia Care Procedure:                Pre-Anesthesia Assessment:                           - Prior to the procedure, a History and Physical                            was performed, and patient medications and                            allergies were reviewed. The patient's tolerance of                            previous anesthesia was also reviewed. The risks                            and benefits of the procedure and the sedation                            options and risks were discussed with the patient.                            All questions were answered, and informed consent                            was obtained. Prior Anticoagulants: The patient has                            taken no previous anticoagulant or antiplatelet                            agents. ASA Grade Assessment: II - A patient with                            mild systemic disease. After reviewing the risks                            and benefits, the patient was deemed in                            satisfactory condition to undergo the procedure.                           After obtaining informed consent, the colonoscope  was passed under direct vision. Throughout the                            procedure, the patient's blood pressure, pulse, and                            oxygen saturations were monitored continuously. The                            CF [id number] #[id number] was introduced through the anus                            and advanced to the the terminal ileum. The                            colonoscopy was performed without  difficulty. The                            patient tolerated the procedure well. The quality                            of the bowel preparation was excellent. The                            terminal ileum, ileocecal valve, appendiceal                            orifice, and rectum were photographed. Scope In: 11:39:50 AM Scope Out: 11:57:15 AM Scope Withdrawal Time: 0 hours 12 minutes 20 seconds  Total Procedure Duration: 0 hours 17 minutes 25 seconds  Findings:                 The terminal ileum appeared normal.                           Two sessile polyps were found in the transverse                            colon and ascending colon. The polyps were 3 to 4                            mm in size. These polyps were removed with a cold                            snare. Resection and retrieval were complete.                           A 5 mm polyp was found in the rectum. The polyp was                            sessile. The polyp was removed with a cold snare.  Resection and retrieval were complete.                           Non-bleeding internal hemorrhoids were found during                            retroflexion. Complications:            No immediate complications. Estimated Blood Loss:     Estimated blood loss was minimal. Impression:               - The examined portion of the ileum was normal.                           - Two 3 to 4 mm polyps in the transverse colon and                            in the ascending colon, removed with a cold snare.                            Resected and retrieved.                           - One 5 mm polyp in the rectum, removed with a cold                            snare. Resected and retrieved.                           - Non-bleeding internal hemorrhoids. Recommendation:           - Discharge patient to home (with escort).                           - Await pathology results.                           - Anusol HC cream  BID for 7 days.                           - The findings and recommendations were discussed                            with the patient.                           - Return to GI clinic in 6 weeks. Dr Georgian Co "Jill Ray" Lorenso Courier,  01/14/2022 12:02:55 PM

## 2022-01-14 NOTE — Progress Notes (Signed)
Called to room to assist during endoscopic procedure.  Patient ID and intended procedure confirmed with present staff. Received instructions for my participation in the procedure from the performing physician.  

## 2022-01-14 NOTE — Progress Notes (Signed)
Report given to PACU, vss 

## 2022-01-15 ENCOUNTER — Telehealth: Payer: Self-pay | Admitting: *Deleted

## 2022-01-15 NOTE — Telephone Encounter (Signed)
  Follow up Call-     01/14/2022   11:23 AM  Call back number  Post procedure Call Back phone  # [phone]  Permission to leave phone message Yes     Patient questions:  Do you have a fever, pain , or abdominal swelling? No. Pain Score  0 *  Have you tolerated food without any problems? Yes.    Have you been able to return to your normal activities? Yes.    Do you have any questions about your discharge instructions: Diet   No. Medications  No. Follow up visit  No.  Do you have questions or concerns about your Care? No.  Actions: * If pain score is 4 or above: No action needed, pain <4.

## 2022-01-19 ENCOUNTER — Encounter: Payer: Self-pay | Admitting: Internal Medicine

## 2022-01-22 ENCOUNTER — Encounter: Payer: Self-pay | Admitting: Obstetrics and Gynecology

## 2022-01-22 ENCOUNTER — Ambulatory Visit: Payer: Medicaid Other | Admitting: Obstetrics and Gynecology

## 2022-01-22 VITALS — BP 118/82 | HR 89 | Ht 64.0 in | Wt 187.5 lb

## 2022-01-22 DIAGNOSIS — Z3046 Encounter for surveillance of implantable subdermal contraceptive: Secondary | ICD-10-CM

## 2022-01-22 DIAGNOSIS — Z30011 Encounter for initial prescription of contraceptive pills: Secondary | ICD-10-CM

## 2022-01-22 MED ORDER — LO LOESTRIN FE 1 MG-10 MCG / 10 MCG PO TABS: 1.0000 | ORAL_TABLET | Freq: Every day | ORAL | 4 refills | Status: DC

## 2022-01-22 NOTE — Progress Notes (Signed)
Pt presents for Nexplanon removal. Nexplanon placed 05/2018. Pt interested in birth control pills.

## 2022-01-22 NOTE — Progress Notes (Signed)
     GYNECOLOGY OFFICE PROCEDURE NOTE  Justina Bertini is a 41 y.o. [id number] here for Nexplanon removal.   No other gynecologic concerns.   Nexplanon Removal Patient identified, informed consent performed, consent signed.   Appropriate time out taken. Nexplanon site identified.  Area prepped in usual sterile fashon. One ml of 1% lidocaine was used to anesthetize the area at the distal end of the implant. A small stab incision was made right beside the implant on the distal portion.  The Nexplanon rod was grasped using hemostats and removed without difficulty.  There was minimal blood loss. There were no complications.  3 ml of 1% lidocaine was injected around the incision for post-procedure analgesia.  2 sutures of 3-0 vicryl were placed over the small incision.  A pressure bandage was applied to reduce any bruising.  The patient tolerated the procedure well and was given post procedure instructions.  Patient is planning to use OCP for contraception.  Will schedule for Annual exam earliest available for patient's school form    Lynnda Shields, MD, [place], [employer] for [employer], [place]

## 2022-02-11 ENCOUNTER — Encounter: Payer: Self-pay | Admitting: Family Medicine

## 2022-02-11 ENCOUNTER — Ambulatory Visit (INDEPENDENT_AMBULATORY_CARE_PROVIDER_SITE_OTHER): Payer: Medicaid Other | Admitting: Family Medicine

## 2022-02-11 ENCOUNTER — Other Ambulatory Visit (HOSPITAL_COMMUNITY)
Admission: RE | Admit: 2022-02-11 | Discharge: 2022-02-11 | Disposition: A | Discharge status: Home / Self Care | Payer: Medicaid Other | Source: Ambulatory Visit | Attending: Family Medicine | Admitting: Family Medicine

## 2022-02-11 VITALS — BP 126/82 | HR 103 | Wt 194.6 lb

## 2022-02-11 DIAGNOSIS — Z23 Encounter for immunization: Secondary | ICD-10-CM | POA: Diagnosis not present

## 2022-02-11 DIAGNOSIS — Z01419 Encounter for gynecological examination (general) (routine) without abnormal findings: Secondary | ICD-10-CM | POA: Insufficient documentation

## 2022-02-11 DIAGNOSIS — Z111 Encounter for screening for respiratory tuberculosis: Secondary | ICD-10-CM

## 2022-02-11 DIAGNOSIS — Z1231 Encounter for screening mammogram for malignant neoplasm of breast: Secondary | ICD-10-CM | POA: Diagnosis not present

## 2022-02-11 NOTE — Patient Instructions (Signed)
[facility] Dept Address: [place], [place], [facility] [address] Hours:  Thursday 8?AM-5?PM Friday 8?AM-5?PM Saturday Closed Sunday Closed Monday 8?AM-5?PM Tuesday 8?AM-5?PM Wednesday 8?AM-5?PM  Phone: [phone]

## 2022-02-11 NOTE — Progress Notes (Signed)
GYNECOLOGY ANNUAL PREVENTATIVE CARE ENCOUNTER NOTE  Subjective:   Jill Ray is a 41 y.o. [phone] female here for a routine annual gynecologic exam.  Current complaints: needs annual exam.     Menses are irregular, had nexplanon removed on 9/22. Has a stitch still in place, non dissolved  Denies abnormal vaginal bleeding, discharge, pelvic pain, problems with intercourse or other gynecologic concerns.    Gynecologic History No LMP recorded (lmp unknown). (Menstrual status: Oral contraceptives). Contraception: oral progesterone-only contraceptive Last Pap: 2017 Results were: normal Last mammogram: never had   Health Maintenance Due  Topic Date Due   COVID-19 Vaccine (1) Never done   FOOT EXAM  Never done   OPHTHALMOLOGY EXAM  Never done   Hepatitis C Screening  Never done   Diabetic kidney evaluation - Urine ACR  04/06/2017   HEMOGLOBIN A1C  06/29/2018   PAP SMEAR-Modifier  02/16/2019    The following portions of the patient's history were reviewed and updated as appropriate: allergies, current medications, past family history, past medical history, past social history, past surgical history and problem list.  Review of Systems Pertinent items are noted in HPI.   Objective:  BP 126/82   Pulse (!) 103   Wt 194 lb 9.6 oz (88.3 kg)   LMP  (LMP Unknown)   Breastfeeding No   BMI 33.40 kg/m  CONSTITUTIONAL: Well-developed, well-nourished female in no acute distress.  HENT:  Normocephalic, atraumatic, External right and left ear normal. Oropharynx is clear and moist EYES:  No scleral icterus.  NECK: Normal range of motion, supple, no masses.  Normal thyroid.  SKIN: Skin is warm and dry. No rash noted. Not diaphoretic. No erythema. No pallor. NEUROLOGIC: Alert and oriented to person, place, and time. Normal reflexes, muscle tone coordination. No cranial nerve deficit noted. PSYCHIATRIC: Normal mood and affect. Normal behavior. Normal judgment and thought  content. CARDIOVASCULAR: Normal heart rate noted, regular rhythm. 2+ distal pulses. RESPIRATORY: Effort and breath sounds normal, no problems with respiration noted. BREASTS: Symmetric in size. No masses, skin changes, nipple drainage, or lymphadenopathy. ABDOMEN: Soft,  no distention noted.  No tenderness, rebound or guarding.  PELVIC: Normal appearing external genitalia; normal appearing vaginal mucosa and cervix.  No abnormal discharge noted.  Pap smear obtained.  Normal uterine size, no other palpable masses, no uterine or adnexal tenderness. MUSCULOSKELETAL: Normal range of motion.   PROCEDURE NOTE: Removed sutures that were non dissolved from nexplanon placement on 9/22    Assessment and Plan:  1) Annual gynecologic examination with pap smear:  Will follow up results of pap smear and manage accordingly. STI screening desired No.  Routine preventative health maintenance measures emphasized. Reviewed perimenopausal symptoms and management.   2) Contraception counseling: Removed nexplanon on 9/22. Has nto started pills but is happy with this choice and plans to start after next period.   1. Encounter for mammogram to establish baseline mammogram - MM Digital Screening; Future  2. Well woman exam with routine gynecological exam Up to date Reviewed preventative care Needs multiple vaccinations for school. Recommended [facility] for immunizations  Patient needs TB screening, previously screen as part of immigration and was negative.  - Cytology - PAP( [place]) - Flu Vaccine QUAD 36+ mos IM (Fluarix, Quad PF) - MM Digital Screening; Future  3. Screening-pulmonary TB - QuantiFERON-TB Gold Plus   Please refer to After Visit Summary for other counseling recommendations.   Return in about 1 year (around 02/12/2023) for Yearly wellness exam.  Caren Macadam, MD, MPH, ABFM Attending Physician Center for [facility]

## 2022-02-14 LAB — QUANTIFERON-TB GOLD PLUS
QuantiFERON Mitogen Value: >10 IU/mL
QuantiFERON Nil Value: 0.29 IU/mL
QuantiFERON TB1 Ag Value: 0.41 IU/mL
QuantiFERON TB2 Ag Value: 0.5 IU/mL
QuantiFERON-TB Gold Plus: NEGATIVE

## 2022-02-16 LAB — CYTOLOGY - PAP
Comment: NEGATIVE
Diagnosis: UNDETERMINED — AB
High risk HPV: NEGATIVE

## 2022-02-22 ENCOUNTER — Telehealth: Payer: Self-pay | Admitting: Family Medicine

## 2022-02-22 NOTE — Telephone Encounter (Signed)
Patient requesting her lab results

## 2022-02-23 NOTE — Telephone Encounter (Signed)
Called patient x 2; phone rings and then message states "call cannot be completed at this time."

## 2022-03-02 DIAGNOSIS — Z0184 Encounter for antibody response examination: Secondary | ICD-10-CM | POA: Diagnosis not present

## 2022-03-05 ENCOUNTER — Encounter: Payer: Self-pay | Admitting: Internal Medicine

## 2022-03-05 ENCOUNTER — Ambulatory Visit: Payer: Medicaid Other | Admitting: Internal Medicine

## 2022-03-05 VITALS — BP 126/64 | HR 91 | Ht 64.0 in | Wt 188.4 lb

## 2022-03-05 DIAGNOSIS — K5900 Constipation, unspecified: Secondary | ICD-10-CM

## 2022-03-05 DIAGNOSIS — K429 Umbilical hernia without obstruction or gangrene: Secondary | ICD-10-CM | POA: Diagnosis not present

## 2022-03-05 DIAGNOSIS — K649 Unspecified hemorrhoids: Secondary | ICD-10-CM | POA: Diagnosis not present

## 2022-03-05 NOTE — Patient Instructions (Addendum)
_______________________________________________________  If you are age 41 or older, your body mass index should be between 23-30. Your Body mass index is 32.33 kg/m. If this is out of the aforementioned range listed, please consider follow up with your Primary Care Provider.  If you are age 9 or younger, your body mass index should be between 19-25. Your Body mass index is 32.33 kg/m. If this is out of the aformentioned range listed, please consider follow up with your Primary Care Provider.   ________________________________________________________  The [facility] providers would like to encourage you to use [facility] to communicate with providers for non-urgent requests or questions.  Due to long hold times on the telephone, sending your provider a message by [facility] may be a faster and more efficient way to get a response.  Please allow 48 business hours for a response.  Please remember that this is for non-urgent requests.  _______________________________________________________  Start taking Miralax daily. We will call you to schedule a 1 year follow up visit.  Thank you for entrusting me with your care and choosing [facility].  Dr Lorenso Courier

## 2022-03-05 NOTE — Progress Notes (Signed)
Chief Complaint: Bloating, hemorrhoids  HPI : 41 year old female with history of DM and asthma presents for follow up of bloating and hemorrhoids  Interval History: She is able to go to the bathroom more regularly. She is taking apple cider vinegar regularly , which she thinks has been helping. She is having one BM every other day on average. Her rectal bleeding and pain are better. Her hemorrhoids have been better as well. She occasional has some discomfort over her umbilical hernia. She is hydrating well and eating fiber in her diet.  Wt Readings from Last 3 Encounters:  03/05/22 188 lb 6 oz (85.4 kg)  02/11/22 194 lb 9.6 oz (88.3 kg)  01/22/22 187 lb 8 oz (85 kg)   Current Outpatient Medications  Medication Sig Dispense Refill   acetaminophen (TYLENOL) 325 MG tablet Take 650 mg by mouth every 6 (six) hours as needed for mild pain or headache.     albuterol (VENTOLIN HFA) 108 (90 Base) MCG/ACT inhaler Inhale 2 puffs into the lungs every 2 (two) hours as needed for wheezing or shortness of breath.     ibuprofen (ADVIL) 600 MG tablet Take 1 tablet (600 mg total) by mouth 3 (three) times daily. 20 tablet 0   LO LOESTRIN FE 1 MG-10 MCG / 10 MCG tablet Take 1 tablet by mouth daily. (Patient not taking: Reported on 02/11/2022) 28 tablet 4   OVER THE COUNTER MEDICATION Place 1 spray into the nose daily as needed (allergies). Pt states uses son's nasal spray when needed for allergies but unsure of the name of it. (Patient not taking: Reported on 02/11/2022)     No current facility-administered medications for this visit.   Physical Exam: BP 126/64   Pulse 91   Ht '5\' 4"'$  (1.626 m)   Wt 188 lb 6 oz (85.4 kg)   LMP  (LMP Unknown)   BMI 32.33 kg/m  Constitutional: Pleasant,well-developed, female in no acute distress. HEENT: Normocephalic and atraumatic. Conjunctivae are normal. No scleral icterus. Cardiovascular: Normal rate, regular rhythm.  Pulmonary/chest: Effort normal and breath sounds  normal. No wheezing, rales or rhonchi. Abdominal: Soft, nondistended, non-tender. Umbilical hernia present. Bowel sounds active throughout. There are no masses palpable. No hepatomegaly. Extremities: No edema Neurological: Alert and oriented to person place and time. Skin: Skin is warm and dry. No rashes noted. Psychiatric: Normal mood and affect. Behavior is normal.  Labs 03/2021: CBC and BMP unremarkable  CT A/P w/contrast 01/13/22: IMPRESSION: 1. No acute abnormality identified in the abdomen and pelvis. 2. 4 mm nonobstructing stone in the midpole right kidney. 3. Broad-base anterior bulging of mesenteric fat and bowel loop at the umbilicus. 4. Bilateral simple cysts in bilateral ovaries, larger in the right ovary measures 3.6 cm. No follow-up imaging recommended note: This recommendation does not apply to premenarchal patients and to those with increased risk (genetic, family history, elevated tumor markers or other high-risk factors) of ovarian cancer. Reference: JACR 2020 Feb; 17(2):248-254 5. Question septate uterus.  ERCP 08/13/12:   Colonoscopy 01/14/22: - The examined portion of the ileum was normal. - Two 3 to 4 mm polyps in the transverse colon and in the ascending colon, removed with a cold snare. Resected and retrieved. - One 5 mm polyp in the rectum, removed with a cold snare. Resected and retrieved. - Non-bleeding internal hemorrhoids. Path: 1. Surgical [P], colon, transverse and ascending, polyp (2) - TUBULAR ADENOMA. - NO HIGH GRADE DYSPLASIA OR MALIGNANCY. - SEPARATE FRAGMENT OF BENIGN COLONIC  MUCOSA WITH LYMPHOID AGGREGATE 2. Surgical [P], colon, rectum, polyp (1) - POLYPOID FRAGMENT OF BENIGN COLONIC MUCOSA WITH NO SIGNIFICANT PATHOLOGIC CHANGES  ASSESSMENT AND PLAN: Bloating Constipation Hemorrhoids Umbilical ab pain Patient has been doing better in terms of her bloating and constipation. Her hemorrhoids have also been better. I encouraged her to use  Miralax to help with any residual constipation issues. Her umbilical hernia was soft and reducible on exam today.  - Previously encouraged her to drink 8 cups water, walk 30 min per day, increase fiber intake to 25 mg per day - Start daily Miralax - RTC in 1 year  Christia Reading, MD  I spent 30 minutes of time, including in depth chart review, independent review of results as outlined above, communicating results with the patient directly, face-to-face time with the patient, coordinating care, and ordering studies and medications as appropriate, and documentation.

## 2022-04-30 ENCOUNTER — Ambulatory Visit
Admission: RE | Admit: 2022-04-30 | Discharge: 2022-04-30 | Disposition: A | Discharge status: Home / Self Care | Payer: Medicaid Other | Source: Ambulatory Visit | Attending: Family Medicine | Admitting: Family Medicine

## 2022-04-30 DIAGNOSIS — Z01419 Encounter for gynecological examination (general) (routine) without abnormal findings: Secondary | ICD-10-CM

## 2022-04-30 DIAGNOSIS — Z1231 Encounter for screening mammogram for malignant neoplasm of breast: Secondary | ICD-10-CM | POA: Diagnosis not present

## 2022-07-24 ENCOUNTER — Other Ambulatory Visit: Payer: Self-pay | Admitting: Obstetrics and Gynecology

## 2022-07-24 DIAGNOSIS — Z30011 Encounter for initial prescription of contraceptive pills: Secondary | ICD-10-CM

## 2023-03-11 ENCOUNTER — Encounter: Payer: Self-pay | Admitting: Internal Medicine
